# Patient Record
Sex: Female | Born: 1985
Health system: Southern US, Community
[De-identification: ages and names within clinical notes are randomized; demographics above are authoritative.]

## PROBLEM LIST (undated history)

## (undated) DIAGNOSIS — D649 Anemia, unspecified: Secondary | ICD-10-CM

## (undated) DIAGNOSIS — Z789 Other specified health status: Secondary | ICD-10-CM

## (undated) DIAGNOSIS — O139 Gestational [pregnancy-induced] hypertension without significant proteinuria, unspecified trimester: Secondary | ICD-10-CM

## (undated) HISTORY — DX: Other specified health status: Z78.9

## (undated) HISTORY — PX: NO PAST SURGERIES: SHX2092

---

## 2019-10-10 DIAGNOSIS — Z419 Encounter for procedure for purposes other than remedying health state, unspecified: Secondary | ICD-10-CM | POA: Diagnosis not present

## 2019-11-10 DIAGNOSIS — Z419 Encounter for procedure for purposes other than remedying health state, unspecified: Secondary | ICD-10-CM | POA: Diagnosis not present

## 2019-12-10 DIAGNOSIS — Z419 Encounter for procedure for purposes other than remedying health state, unspecified: Secondary | ICD-10-CM | POA: Diagnosis not present

## 2019-12-17 DIAGNOSIS — O3680X1 Pregnancy with inconclusive fetal viability, fetus 1: Secondary | ICD-10-CM | POA: Diagnosis not present

## 2019-12-17 DIAGNOSIS — Z3402 Encounter for supervision of normal first pregnancy, second trimester: Secondary | ICD-10-CM | POA: Diagnosis not present

## 2019-12-17 DIAGNOSIS — Z3201 Encounter for pregnancy test, result positive: Secondary | ICD-10-CM | POA: Diagnosis not present

## 2019-12-17 LAB — OB RESULTS CONSOLE ABO/RH: RH Type: NEGATIVE

## 2019-12-17 LAB — OB RESULTS CONSOLE VARICELLA ZOSTER ANTIBODY, IGG: Varicella: IMMUNE

## 2019-12-17 LAB — HEPATITIS C ANTIBODY: HCV Ab: NEGATIVE

## 2019-12-17 LAB — CYTOLOGY - PAP: Pap: NEGATIVE

## 2019-12-17 LAB — OB RESULTS CONSOLE HIV ANTIBODY (ROUTINE TESTING): HIV: NONREACTIVE

## 2019-12-17 LAB — OB RESULTS CONSOLE HEPATITIS B SURFACE ANTIGEN: Hepatitis B Surface Ag: NEGATIVE

## 2019-12-17 LAB — OB RESULTS CONSOLE ANTIBODY SCREEN: Antibody Screen: NEGATIVE

## 2019-12-17 LAB — OB RESULTS CONSOLE RUBELLA ANTIBODY, IGM: Rubella: IMMUNE

## 2019-12-17 LAB — OB RESULTS CONSOLE HGB/HCT, BLOOD
HCT: 36 (ref 29–41)
Hemoglobin: 12.4

## 2019-12-17 LAB — CYSTIC FIBROSIS DIAGNOSTIC STUDY: Interpretation-CFDNA:: NORMAL

## 2019-12-17 LAB — OB RESULTS CONSOLE RPR: RPR: NONREACTIVE

## 2019-12-17 LAB — OB RESULTS CONSOLE TSH: TSH: 1.49

## 2019-12-17 LAB — SICKLE CELL SCREEN: Sickle Cell Screen: NORMAL

## 2019-12-17 LAB — OB RESULTS CONSOLE PLATELET COUNT: Platelets: 206

## 2019-12-31 DIAGNOSIS — Z3402 Encounter for supervision of normal first pregnancy, second trimester: Secondary | ICD-10-CM | POA: Diagnosis not present

## 2019-12-31 DIAGNOSIS — Z1379 Encounter for other screening for genetic and chromosomal anomalies: Secondary | ICD-10-CM | POA: Diagnosis not present

## 2020-01-10 DIAGNOSIS — Z419 Encounter for procedure for purposes other than remedying health state, unspecified: Secondary | ICD-10-CM | POA: Diagnosis not present

## 2020-01-25 DIAGNOSIS — Z3689 Encounter for other specified antenatal screening: Secondary | ICD-10-CM | POA: Diagnosis not present

## 2020-02-09 DIAGNOSIS — Z419 Encounter for procedure for purposes other than remedying health state, unspecified: Secondary | ICD-10-CM | POA: Diagnosis not present

## 2020-03-11 DIAGNOSIS — Z419 Encounter for procedure for purposes other than remedying health state, unspecified: Secondary | ICD-10-CM | POA: Diagnosis not present

## 2020-03-18 DIAGNOSIS — Z3A27 27 weeks gestation of pregnancy: Secondary | ICD-10-CM | POA: Diagnosis not present

## 2020-03-18 DIAGNOSIS — K920 Hematemesis: Secondary | ICD-10-CM | POA: Diagnosis not present

## 2020-03-18 DIAGNOSIS — O26893 Other specified pregnancy related conditions, third trimester: Secondary | ICD-10-CM | POA: Diagnosis not present

## 2020-03-18 DIAGNOSIS — O212 Late vomiting of pregnancy: Secondary | ICD-10-CM | POA: Diagnosis not present

## 2020-03-21 DIAGNOSIS — Z131 Encounter for screening for diabetes mellitus: Secondary | ICD-10-CM | POA: Diagnosis not present

## 2020-03-22 LAB — OB RESULTS CONSOLE RPR: RPR: NONREACTIVE

## 2020-03-22 LAB — OB RESULTS CONSOLE HIV ANTIBODY (ROUTINE TESTING): HIV: NONREACTIVE

## 2020-03-22 LAB — GLUCOSE TOLERANCE, 1 HOUR: Glucose, 1 Hour GTT: 117

## 2020-03-31 ENCOUNTER — Encounter (HOSPITAL_COMMUNITY): Payer: Self-pay | Admitting: Obstetrics and Gynecology

## 2020-03-31 ENCOUNTER — Inpatient Hospital Stay (HOSPITAL_COMMUNITY)
Admission: AD | Admit: 2020-03-31 | Discharge: 2020-03-31 | Disposition: A | Discharge status: Home / Self Care | Payer: Medicaid Other | Attending: Family Medicine | Admitting: Family Medicine

## 2020-03-31 ENCOUNTER — Other Ambulatory Visit: Payer: Self-pay

## 2020-03-31 DIAGNOSIS — O360130 Maternal care for anti-D [Rh] antibodies, third trimester, not applicable or unspecified: Secondary | ICD-10-CM

## 2020-03-31 DIAGNOSIS — O26893 Other specified pregnancy related conditions, third trimester: Secondary | ICD-10-CM | POA: Insufficient documentation

## 2020-03-31 DIAGNOSIS — Z6791 Unspecified blood type, Rh negative: Secondary | ICD-10-CM | POA: Diagnosis not present

## 2020-03-31 DIAGNOSIS — O26899 Other specified pregnancy related conditions, unspecified trimester: Secondary | ICD-10-CM

## 2020-03-31 DIAGNOSIS — Z3A30 30 weeks gestation of pregnancy: Secondary | ICD-10-CM

## 2020-03-31 MED ORDER — ONDANSETRON 4 MG PO TBDP: 4.0000 mg | ORAL_TABLET | Freq: Four times a day (QID) | ORAL | 0 refills | Status: DC | PRN

## 2020-03-31 MED ORDER — PREPLUS 27-1 MG PO TABS: 1.0000 | ORAL_TABLET | Freq: Every day | ORAL | 13 refills | Status: DC

## 2020-03-31 NOTE — MAU Note (Signed)
Presents for Rhogam injection.  Reports moving from [place], [place] to [place], [place] hasn't established care @ any office yet but needs Rhogam injection.  Denies any complaints.

## 2020-03-31 NOTE — MAU Provider Note (Signed)
Event Date/Time   First Provider Initiated Contact with Patient 03/31/20 1104      S Ashley Michael is a 35 y.o. G1P0 patient who presents to MAU today with complaint of needing Rhogam shot. [place]. No bleeding, cramping, spotting, leaking fluid. Is vomiting every morning.   O BP 127/88 (BP Location: Right Arm)   Pulse (!) 101   Temp 98 F (36.7 C) (Oral)   Resp 20   Ht 5\' 2"  (1.575 m)   Wt 80.7 kg   LMP 09/01/2019   SpO2 100%   BMI 32.54 kg/m  Physical Exam Vitals and nursing note reviewed.  Constitutional:      Appearance: Normal appearance.  Skin:    General: Skin is warm and dry.     Capillary Refill: Capillary refill takes less than 2 seconds.  Neurological:     General: No focal deficit present.     Mental Status: She is alert and oriented to person, place, and time.  Psychiatric:        Mood and Affect: Mood normal.        Behavior: Behavior normal.        Thought Content: Thought content normal.        Judgment: Judgment normal.     A Medical screening exam complete   ICD-10-CM   1. Rh negative state in antepartum period  O26.899    Z67.91   2. [redacted] weeks gestation of pregnancy  Z3A.30      P Discharge from MAU in stable condition Will arrange for patient to receive Rhogam injection at [facility].  If she starts bleeding, she is to return. Warning signs for worsening condition that would warrant emergency follow-up discussed Patient may return to MAU as needed   [facility], DO 03/31/2020 11:09 AM

## 2020-04-04 ENCOUNTER — Other Ambulatory Visit: Payer: Self-pay

## 2020-04-04 ENCOUNTER — Encounter: Payer: Self-pay | Admitting: Nurse Practitioner

## 2020-04-04 ENCOUNTER — Ambulatory Visit (INDEPENDENT_AMBULATORY_CARE_PROVIDER_SITE_OTHER): Payer: Medicaid Other | Admitting: Nurse Practitioner

## 2020-04-04 VITALS — BP 120/84 | HR 77 | Wt 181.0 lb

## 2020-04-04 DIAGNOSIS — Z23 Encounter for immunization: Secondary | ICD-10-CM | POA: Diagnosis not present

## 2020-04-04 DIAGNOSIS — F17200 Nicotine dependence, unspecified, uncomplicated: Secondary | ICD-10-CM | POA: Diagnosis not present

## 2020-04-04 DIAGNOSIS — O99333 Smoking (tobacco) complicating pregnancy, third trimester: Secondary | ICD-10-CM | POA: Diagnosis not present

## 2020-04-04 DIAGNOSIS — Z3A30 30 weeks gestation of pregnancy: Secondary | ICD-10-CM | POA: Diagnosis not present

## 2020-04-04 DIAGNOSIS — Z7189 Other specified counseling: Secondary | ICD-10-CM

## 2020-04-04 DIAGNOSIS — Z3400 Encounter for supervision of normal first pregnancy, unspecified trimester: Secondary | ICD-10-CM | POA: Insufficient documentation

## 2020-04-04 DIAGNOSIS — Z3403 Encounter for supervision of normal first pregnancy, third trimester: Secondary | ICD-10-CM

## 2020-04-04 DIAGNOSIS — Z818 Family history of other mental and behavioral disorders: Secondary | ICD-10-CM | POA: Diagnosis not present

## 2020-04-04 MED ORDER — RHO D IMMUNE GLOBULIN 1500 UNIT/2ML IJ SOSY
300.0000 ug | PREFILLED_SYRINGE | Freq: Once | INTRAMUSCULAR | Status: AC
  Administered 2020-04-04: 300 ug via INTRAMUSCULAR

## 2020-04-04 MED ORDER — PRENATAL PLUS 27-1 MG PO TABS: 1.0000 | ORAL_TABLET | Freq: Every day | ORAL | 7 refills | Status: AC

## 2020-04-04 NOTE — Progress Notes (Signed)
Subjective:   Ashley Michael is a 35 y.o. G1P0000 at [redacted]w[redacted]d by LMP being seen today for her first obstetrical visit.  Her obstetrical history is significant for smoker and family history of postpartum psychosis. Patient does intend to breast feed. Pregnancy history fully reviewed.  Enclosed prenatal records reviewed - early [place] which confirms EDC and had anatomy US on 01-25-20 with normal anatomy  Patient reports vomiting every morning and heartburn at night.  HISTORY: OB History  Gravida Para Term Preterm AB Living  1 0 0 0 0 0  SAB IAB Ectopic Multiple Live Births  0 0 0 0 0    # Outcome Date GA Lbr Len/2nd Weight Sex Delivery Anes PTL Lv  1 Current            Past Medical History:  Diagnosis Date  . Medical history non-contributory    Past Surgical History:  Procedure Laterality Date  . NO PAST SURGERIES     Family History  Problem Relation Age of Onset  . Bipolar disorder Mother      No Known Allergies Current Outpatient Medications on File Prior to Visit  Medication Sig Dispense Refill  . calcium carbonate (TUMS - DOSED IN MG ELEMENTAL CALCIUM) 500 MG chewable tablet Chew 1 tablet by mouth daily.     No current facility-administered medications on file prior to visit.     Exam   Vitals:   04/04/20 0926  BP: 120/84  Pulse: 77  Weight: 181 lb (82.1 kg)   Fetal Heart Rate (bpm): 142  Uterus:  Fundal Height: 31 cm  Pelvic Exam: Perineum:  pelvic deferred   Vulva:    Vagina:     Cervix:    Adnexa:    Bony Pelvis:   System: General: well-developed, well-nourished female in no acute distress   Breast:  deferred   Skin: normal coloration and turgor, no rashes   Neurologic: oriented, normal, negative, normal mood   Extremities: normal strength, tone, and muscle mass, ROM of all joints is normal   HEENT extraocular movement intact and sclera clear, anicteric   Mouth/Teeth deferred   Neck supple and no masses, normal thyroid   Cardiovascular:  regular rate and rhythm   Respiratory:  no respiratory distress, normal breath sounds   Abdomen: soft, non-tender; no masses,  no organomegaly     Assessment:   Pregnancy: G1P0000 Patient Active Problem List   Diagnosis Date Noted  . Supervision of low-risk first pregnancy 04/04/2020  . Current smoker 04/04/2020  . Family history of depression 04/04/2020     Plan:  1. Encounter for supervision of low-risk first pregnancy in third trimester Her MyChart is on her stepmother's phone - her phone is not active at present Does not have BP cuff Thought she would not be able to be pregnant - has never used birth control and has not been pregnant before.  This pregnancy is causing her to shift things in her life.  Definitely a wanted but unexpected pregnancy.  Always had regular menses. Is vomiting every morning - did not realize that stomach being empty triggers vomiting in pregnancy Will try crackers in the morning for vomiting. Takes Tums for heartburn at night - is satisfied with this treatment for now. Declines flu vaccine.  - prenatal vitamin w/FE, FA (PRENATAL 1 + 1) 27-1 MG TABS tablet; Take 1 tablet by mouth daily at 12 noon. (Patient not taking: Reported on 04/04/2020)  Dispense: 30 tablet; Refill: 7 -  Tdap vaccine greater than or equal to 7yo IM - rho (d) immune globulin (RHIG/RHOPHYLAC) injection 300 mcg  2. Current smoker Previously 1ppd, now 7-8 cig per day. May be interested in stopping smoking - does not want to schedule visit with Behavioral Health for help today - may at next [facility].  3. Family history of depression Mother had postpartum psychosis and was hospitalized for depression several times. Declines to meet with Behavorial Health at this time - encouraged that we recommend this visit for all new OB clients, but patient is unwilling to schedule at this visit.  May want to schedule later.  COVID-19 Vaccine Counseling: The patient was counseled on the potential benefits  and lack of known risks of COVID vaccination, during pregnancy and breastfeeding, during today's visit. The patient's questions and concerns were addressed today, including safety of the vaccination and potential side effects as they have been published by ACOG and [facility]. The patient has been informed that there have not been any documented vaccine related injuries, deaths or birth defects to infant or mom after receiving the COVID-19 vaccine to date. The patient has been made aware that although she is not at increased risk of contracting COVID-19 during pregnancy, she is at increased risk of developing severe disease and complications if she contracts COVID-19 while pregnant. All patient questions were addressed during our visit today. The patient is not planning to get vaccinated at this time.    Initial labs and ultrasound reviewed from prenatal records. Continue prenatal vitamins. . Problem list reviewed and updated. The nature of [facility] with multiple MDs and other Advanced Practice Providers was explained to patient; also emphasized that residents, students are part of our team. Routine obstetric precautions reviewed. Return in about 2 weeks (around 04/18/2020) for ROB in person.  Total face-to-face time with patient: 40 minutes.  Over 50% of encounter was spent on counseling and coordination of care.     Nolene Bernheim, FNP Family Nurse Practitioner, [facility] for [employer], [facility] 04/04/2020 10:25 AM

## 2020-04-04 NOTE — Patient Instructions (Addendum)
AREA PEDIATRIC/FAMILY PRACTICE PHYSICIANS  [place] ([address]) . [facility] o Chambliss, MD; Eniola, MD; Hale, MD; Hensel, MD; McDiarmid, MD; McIntyer, MD; Neal, MD; Walden, MD o [address] [place], [place] [address] o [phone] o Mon-Fri 8:30-12:30, 1:30-5:00 o Providers come to see babies at [facility] o Accepting Medicaid . [facility] o Limited providers who accept newborns: Koirala, MD; Morrow, MD; Wolters, MD o [address], [place], [place] [address] o [phone] o Mon-Fri 8:00-5:30 o Babies seen by providers at [facility] o Does NOT accept Medicaid o Please call early in hospitalization for appointment (limited availability)  . [facility] o Mulberry, MD o [address] [place], [place] [address] o [phone] o Mon, Tue, Thur, Fri 8:30-5:00, Wed 10:00-7:00 (closed 1-2pm) o Babies seen by [facility] providers o Accepting Medicaid . [facility] - Pediatrician o Rubin, MD o [address], [place], [place] [address] o [phone] o Mon-Fri 8:30-5:00, Sat 8:30-12:00 o Provider comes to see babies at [facility] o Accepting Medicaid o Must have been referred from current patients or contacted office prior to delivery . [facility] ([facility]) o Brown, MD; Chandler, MD; Ettefagh, MD; Grant, MD; Lester, MD; McCormick, MD; McQueen, MD; Prose, MD; Simha, MD; Stanley, MD; Stryffeler, NP; Tebben, NP o [address], [place], [place] [address] o [phone] o Mon, Tue, Thur, Fri 8:30-5:30, Wed 9:30-5:30, Sat 8:30-12:30 o Babies seen by [facility] providers o Accepting Medicaid o Only accepting infants of first-time parents or siblings of current patients o Hospital discharge coordinator will make follow-up appointment . Jack Amos o [address],  [place], [place]  [address] o [phone]   Fax - [phone] . [facility] o [address], [place], [place]  [address] o Phone - [phone]   Fax - [phone] . Shilpa Gosrani o [address], [place], [place]  [address] o [phone]  [place] ([address]) . [facility] o Bates, MD; [place]ass[place], MD; Cooper, Cox, MD; MD; Davis, MD; Dovico, MD; Ettefaugh, MD; Little, MD; Lowe, MD; Keiffer, MD; Melvin, MD; Sumner, MD; Williams, MD o [address], [place], [place] [address] o [phone] o Mon-Fri 8:30-5:00 (extended evenings Mon-Thur as needed), Sat-Sun 10:00-1:00 o Providers come to see babies at [facility] o Accepting Medicaid for families of first-time babies and families with all children in the household age 3 and under. Must register with office prior to making appointment (M-F only). . [facility] o Henson, NP; Knapp, MD; Lalonde, MD; Tysinger, PA o [address] [place], [place] [address] o [phone] o Mon-Fri 8:00-5:00 o Babies seen by providers at [facility] o Does NOT accept Medicaid/Commercial Insurance Only . [facility] ([facility]  o Artis, MD; Barnes, MD; Bratton, MD; Coccaro, MD; Lockett Gardner, MD; Kramer, MD; Marshall, MD; Netherton, MD; Poleto, MD; Skinner, MD o [address] [place], [place] [address] o [phone] o Mon-Fri 8:30-5:30, Sat (Oct.-Mar.) 9:00-1:00 o Babies seen by providers at [facility] o Accepting Medicaid  [place] ([address]) . [facility] o Reid, MD; Warner, MD o [address], [place],  [address] o [phone] o Mon-Fri 8:30-5:00, Sat 8:30-12:00 o Providers come to see babies at [facility] o Does NOT accept Medicaid . [facility] o Becker, PA; Hagler, MD; Scifres, PA; Sun, MD; Swayne, MD o [address],  [place], [place] [address] o [phone] o Mon-Fri 8:00-5:00 o Babies seen by providers at [facility] o Does NOT accept Medicaid o Only accepting babies of parents who are patients o Please call early in hospitalization for appointment (limited availability) . [facility] o Clark, MD; Frye, MD; Kelleher, MD; Mack, NP; Miller, MD; O'Keller, MD; Patterson, NP; Pudlo, MD; Puzio, MD; Thomas, MD; Tucker, MD; Twiselton, MD o [address], [place], [place] [address] o [phone] o Mon-Fri 8:00-5:00, Sat 9:00-12:00 o Providers come to see babies at [facility] o Does NOT accept Medicaid  [place] ([address]) . [facility] o Limited providers accepting new patients: Brake, NP; Wharton, PA o [address], [place], [place] [address] o [phone] o Mon-Fri 8:00-5:00 o Babies seen by providers at [facility] o Does NOT accept Medicaid o Only accepting babies of parents who are patients o Please call early in hospitalization for appointment (limited availability) . [facility] o Gay, MD; Quinlan, MD o [address] [place], [place] [address] o [phone] (press 1 to schedule appointment) o Mon-Fri 8:00-5:00 o Providers come to see babies at [facility] o Does NOT accept Medicaid . [facility] o Mazer, MD o [address] [place], [place] [address] o [phone] o Mon-Fri 8:30-5:00 (lunch 12:30-1:00), extended hours by appointment only Wed 5:00-6:30 o Babies seen by [facility] providers o Accepting Medicaid . [facility] o Banks, MD; Jordan, MD; Koberlein, MD o [address], [place], [place] [address] o [phone] o Mon-Fri 8:00-5:00 o Babies seen by [facility] providers o Does NOT accept Medicaid . [facility] o Parker, MD; Hunter, MD; Wallace, DO o [address] [place], [place]  [address] o [phone] o Mon-Fri 8:00-5:00 o Babies seen by [facility] providers o Does NOT accept Medicaid . [facility] o Brandon, PA; Brecken, PA; Christy, NP; Dees, MD; DeClaire, MD; DeWeese, MD; Hansen, NP; Mills, NP; Parrish, NP; Smoot, NP; Summer, MD; Vapne, MD o [address] [place], [place] [address] o [phone] o Mon-Fri 8:30-5:00, Sat 10:00-1:00 o Providers come to see babies at [facility] o Does NOT accept Medicaid o Free prenatal information session Tuesdays at 4:45pm . [facility] o Bouska, MD; Gordon, PA; Jeffery, PA; Weber, PA o [address] [place] [address] o [phone] o Mon-Fri 7:30-5:30 o Babies seen by [facility] providers . [facility] o [address], [place], [place], [place]  [address] o [phone]   Fax - [phone]  [place] ([phone]) . [facility] o Reese, MD o [address] [place], [place] [address] o [phone] o Mon-Thur 8:00-6:00 o Providers come to see babies at [facility] o Accepting Medicaid . [facility] o Anderson, NP; Badger, MD; Beal, PA; Spencer, PA o [address] [place],  [address] o [phone] o Mon-Thur 7:30-7:30, Fri 7:30-4:30 o Babies seen by [facility] providers o Accepting Medicaid . [facility] o Agbuya, MD; Klett, NP; Romgoolam, MD o [address], ,  [address] o [phone] o Mon-Fri 8:30-5:00, Sat 8:30-12:00 o Providers come to see babies at [facility] o Accepting Medicaid o Must have "Meet & Greet" appointment at office prior to delivery . [facility]  ([facility] ) o McCord,   MD; Juleen China, MD; Clydene Laming, [address], [place], [place] [address] o [phone] o Mon-Wed 8:00-6:00, Thur-Fri 8:00-5:00, Sat 9:00-12:00 o Providers come to  see babies at [facility] o Does NOT accept Medicaid o Only accepting siblings of current patients . [facility] [place], [place], [place], [place]  [address] o [phone]   Fax [phone] . [facility] [address], [place], [place]  [address] o [phone]   Fax - Morton Gorman [phone] & [phone]) . Therapist, music at [place], DO; [place], [address] [place], [place] [address] o [phone] o Mon-Fri 7:00-5:00 o Babies seen by [facility] providers o Does NOT accept Medicaid . [place], MD; [place], [place]; [place], [place], [place], [place] [address] o [phone] o Mon-Fri 8:00-5:00 o Babies seen by [facility] providers o Accepting Medicaid . Lamont, MD; [place], [place]; [place], NP; [place], [place], [place], [place] [address] o [phone] o Mon-Fri 8:00-5:00 o Babies seen by providers at [facility] [place] [phone]) . [facility], [place] o [address] [place], [place] [address] o [phone] o Mon-Fri 8:00-5:00 o Babies seen by [facility] providers o Does NOT accept Medicaid o Limited availability, please call early in hospitalization to schedule follow-up . [facility] Leilani Merl, PA; Maisie Fus, MD; [place], MD; [place], [place]; Jeannine Kitten, MD; [place], [place] [facility] [address], [place], [place] [address] o [phone] o Mon-Fri 8:30-5:00, Sat 9:00-12:00 o Babies seen by providers at [facility] o Accepting Medicaid o Please register online then schedule online or call office o [email or web address] . [place] [facility]  [place]) Kristian Covey, NP; Dwyane Dee, MD; Leonidas Romberg, PA o [address] [place], [place], [place] [address] o [phone] o Mon-Fri 8:00-5:00 o Babies seen by providers at [facility] o Accepting Medicaid . [place] ([facility]) Dairl Ponder, MD; Rayvon Char, NP; Melina Modena, MD o [address] [place], [place], [place] [address] o [phone] o Mon-Fri 8:00-5:30, Sat&Sun by appointment (phones open at 8:30) o Babies seen by [facility] providers o Accepting Medicaid o Must be a first-time baby or sibling of current patient . [place], [address], [place], [place]  [address] o [phone]   Fax - [phone]  [place] [phone] & [phone]) . [place], [place]; [place], [place]; Benjamine Mola, MD; [place], [place]; Harrell Lark, MD o [address] [place], [place] [address] o [phone] o Mon-Thur 8:00-7:00, Fri 8:00-5:00, Sat 8:00-12:00, Sun 9:00-12:00 o Babies seen by [facility] providers o Accepting Medicaid . [facility], MD; Ruthann Cancer, MD; [facility], MD o [address], [place], [place] [address] o [phone] o Mon-Thur 8:00-5:00 o Babies seen by providers at [facility] o Accepting Medicaid . [facility] [place], MD; Coe[place], MD; Amedeo Plenty, MD; Bobby Rumpf, MD; List, MD; Lavonia Drafts, MD; Ruthann Cancer, MD; Selinda Eon, MD; Audie Box, MD; Jim Like, MD; Christie Nottingham, MD; Hubbard Hartshorn, MD; Modena Nunnery, MD o [address] [place], [place]  [address] o [phone] o Mon-Fri 8:00-5:30, Sat (Oct.-Mar.) 9:00-1:00 o Babies seen by providers at [facility] o Accepting Medicaid o Must fill out new patient packet, available online at [email or web address] . [place] ([facility]) Barnabas Lister, NP; [place]on Kingfisher, NP; Claiborne Billings, NP; Rolla Plate, MD;  [place], [place]; Carola Rhine, MD; Tyron Russell, MD; Delia Chimes, NP o [address], [place], [place] [address] o [phone] o Mon-Thur 8:00-5:30, Fri 8:00-5:00 o Babies seen by providers at [place] [phone]) . [place], [place]; [place], MD; Dennard Schaumann, MD; [place], [place] o [address] [place], [place] [address] o [phone] o Mon-Fri 8:00-5:00 o Babies seen by providers at [place] [phone]) . [place] [facility] [place], [place]; Olen Pel, MD; [place], [place], [place], [place] [address] o [phone] o Mon-Fri 8:00-5:00 o Babies seen by providers at [facility] o Does NOT accept Medicaid o Limited appointment availability, please call early in hospitalization  . Therapist, music at [place], [place]; [place], [address], [place], [place] [address] o [phone] o Mon-Fri 8:00-5:00 o Babies seen by [facility] providers o Does NOT accept Medicaid . [facility] Su Grand, MD; Guy Sandifer, MD; [place], [place]; [place], [place] [address], [place], [place] [address] o [phone] o Mon-Fri 8:00-5:00 o After hours clinic [facility] [place], [place] [address]) [phone] Mon-Fri 5:00-8:00, Sat 12:00-6:00, Sun 10:00-4:00 o Babies seen by [facility] providers o Accepting Medicaid . [place] [facility] o [address], [place], [place]  [address] o [phone]   Fax - [phone]  [place] [phone]) . Therapist, music at [facility], MD o [address] [place], [place], [place] [address] o [phone] o Mon-Fri 8:00-5:00 o Babies seen by [facility] providers o Does NOT accept Medicaid . [place] [facility]) Bing Neighbors, MD o [address] [place], [place],   [address] o [phone] o Mon-Thur 8:00-7:00, Fri 8:00-5:00, Sat 8:00-12:00 o Babies seen by providers at [facility] o Accepting Medicaid - but does not have vaccinations in office (must be received elsewhere) o Limited availability, please call early in hospitalization  [place] ([address]) . [place], MD o [address] [place] [address] o [phone]  Fax [phone]   Childbirth Education Options: [facility] Department Classes:  Childbirth education classes can help you get ready for a positive parenting experience. You can also meet other expectant parents and get free stuff for your baby. Each class runs for five weeks on the same night and costs $45 for the mother-to-be and her support person. Medicaid covers the cost if you are eligible. Call [phone] to register. [facility] Childbirth Education:  [phone] or [phone] or [email or web address]  Baby & Me Class: Discuss newborn & infant parenting and family adjustment issues with other new mothers in a relaxed environment. Each week brings a new speaker or baby-centered activity. We encourage new mothers to join [place] every Thursday at 11:00am. Babies birth until crawling. No registration or fee. Daddy [employer]: This course offers Dads-to-be the tools and knowledge needed to feel confident on their journey to becoming new fathers. Experienced dads, who have been trained as coaches, teach dads-to-be how  to hold, comfort, diaper, swaddle and play with their infant while being able to support the new mom as well. A class for men taught by men. $25/dad Big Brother/Big Sister: Let your children share in the joy of a new brother or sister in this special class designed just for them. Class includes discussion about how families care for babies: swaddling, holding, diapering, safety as well as how they can be helpful in their new role. This class is designed for  children ages 77 to 29, but any age is welcome. Please register each child individually. $5/child  Mom Talk: This mom-led group offers support and connection to mothers as they journey through the adjustments and struggles of that sometimes overwhelming first year after the birth of a child. Tuesdays at 10:00am and Thursdays at 6:00pm. Babies welcome. No registration or fee. Breastfeeding Support Group: This group is a mother-to-mother support circle where moms have the opportunity to share their breastfeeding experiences. A Lactation Consultant is present for questions and concerns. Meets each Tuesday at 11:00am. No fee or registration. Breastfeeding Your Baby: Learn what to expect in the first days of breastfeeding your newborn.  This class will help you feel more confident with the skills needed to begin your breastfeeding experience. Many new mothers are concerned about breastfeeding after leaving the hospital. This class will also address the most common fears and challenges about breastfeeding during the first few weeks, months and beyond. (call for fee) Comfort Techniques and Tour: This 2 hour interactive class will provide you the opportunity to learn & practice hands-on techniques that can help relieve some of the discomfort of labor and encourage your baby to rotate toward the best position for birth. You and your partner will be able to try a variety of labor positions with birth balls and rebozos as well as practice breathing, relaxation, and visualization techniques. A tour of the [facility] is included with this class. $20 per registrant and support person Childbirth Class- Weekend Option: This class is a Weekend version of our Birth & Baby series. It is designed for parents who have a difficult time fitting several weeks of classes into their schedule. It covers the care of your newborn and the basics of labor and childbirth. It also includes a [place]  [facility] and lunch. The class is held two consecutive days: beginning on Friday evening from 6:30 - 8:30 p.m. and the next day, Saturday from 9 a.m. - 4 p.m. (call for fee) Doren Custard Class: Interested in a waterbirth?  This informational class will help you discover whether waterbirth is the right fit for you. Education about waterbirth itself, supplies you would need and how to assemble your support team is what you can expect from this class. Some obstetrical practices require this class in order to pursue a waterbirth. (Not all obstetrical practices offer waterbirth-check with your healthcare provider.) Register only the expectant mom, but you are encouraged to bring your partner to class! Required if planning waterbirth, no fee. Infant/Child CPR: Parents, grandparents, babysitters, and friends learn Cardio-Pulmonary Resuscitation skills for infants and children. You will also learn how to treat both conscious and unconscious choking in infants and children. This Family & Friends program does not offer certification. Register each participant individually to ensure that enough mannequins are available. (Call for fee) Grandparent Love: Expecting a grandbaby? This class is for you! Learn about the latest infant care and safety recommendations and ways to support your own child  as he or she transitions into the parenting role. Taught by Registered Nurses who are childbirth instructors, but most importantly...they are grandmothers too! $10/person. Childbirth Class- Natural Childbirth: This series of 5 weekly classes is for expectant parents who want to learn and practice natural methods of coping with the process of labor and childbirth. Relaxation, breathing, massage, visualization, role of the partner, and helpful positioning are highlighted. Participants learn how to be confident in their body's ability to give birth. This class will empower and help parents make informed decisions about their own  care. Includes discussion that will help new parents transition into the immediate postpartum period. Maternity Care Center Tour of [facility] is included. We suggest taking this class between 25-32 weeks, but it's only a recommendation. $75 per registrant and one support person or $30 Medicaid. Childbirth Class- 3 week Series: This option of 3 weekly classes helps you and your labor partner prepare for childbirth. Newborn care, labor & birth, cesarean birth, pain management, and comfort techniques are discussed and a Maternity Care Center Tour of [facility] is included. The class meets at the same time, on the same day of the week for 3 consecutive weeks beginning with the starting date you choose. $60 for registrant and one support person.  Marvelous Multiples: Expecting twins, triplets, or more? This class covers the differences in labor, birth, parenting, and breastfeeding issues that face multiples' parents. NICU tour is included. Led by a Certified Childbirth Educator who is the mother of twins. No fee. Caring for Baby: This class is for expectant and adoptive parents who want to learn and practice the most up-to-date newborn care for their babies. Focus is on birth through the first six weeks of life. Topics include feeding, bathing, diapering, crying, umbilical cord care, circumcision care and safe sleep. Parents learn to recognize symptoms of illness and when to call the pediatrician. Register only the mom-to-be and your partner or support person can plan to come with you! $10 per registrant and support person Childbirth Class- online option: This online class offers you the freedom to complete a Birth and Baby series in the comfort of your own home. The flexibility of this option allows you to review sections at your own pace, at times convenient to you and your support people. It includes additional video information, animations, quizzes, and extended activities. Get organized with helpful  eClass tools, checklists, and trackers. Once you register online for the class, you will receive an email within a few days to accept the invitation and begin the class when the time is right for you. The content will be available to you for 60 days. $60 for 60 days of online access for you and your support people.    Check out Covid vaccine info here:   [email or web address].ncdhhs.gov/vaccines  7 Things You Should Know About the COVID-19 Vaccines  Here are seven facts you should know before taking your shot:  1.  No serious side effects were reported in clinical trials. Temporary reactions after receiving the vaccine may include a sore arm, headache, feeling tired and achy for a day or two or, in some cases, fever. In most cases, these reactions are good signs that your body is building protection.   2.  Scientists had a head start. They are built on decades of research on vaccines for similar viruses. A big investment of resources and focus made sure they were created without skipping any steps in development, testing, or clinical trials.  3. You cannot  get COVID-19 from the vaccine. The vaccine gives your body instructions to make a protein that safely teaches you to make germ-fighting antibodies to fight the real COVID-19.  4.The vaccine protects against the Delta variant. The Delta variant, which is now predominant in [place], is much more contagious than the original virus. Vaccines continue to be remarkably effective in reducing risk of severe disease, hospitalization, and death, even against the Delta variant.  5. A hundred million people in the [place] have already received their COVID-19 vaccine.  6. It works. And once you're fully vaccinated you're protected. The vaccines are proven to help prevent COVID-19 and are effective in preventing hospitalization and death.   7. The vaccine does not affect fertility. Vaccination for those who are pregnant or wanting to become pregnant is  recommended by the [employer] of Obstetricians and Gynecologists (ACOG), the Society for Maternal-Fetal Medicine (SMFM), the American Society for Reproductive Medicine (ASRM), and the Society for Female Reproduction and Urology.           Hormonal Contraception Information Hormonal contraception is a type of birth control that uses hormones to prevent pregnancy. It usually involves a combination of the hormones estrogen and progesterone, or only the hormone progesterone. Hormonal contraception works in these ways:  It thickens the mucus in the cervix, which is the lowest part of the uterus. Thicker mucus makes it harder for sperm to enter the uterus.  It changes the lining of the uterus. This makes it harder for an egg to attach or implant.  It may stop the ovaries from releasing eggs (ovulation). Some women who take hormonal contraceptives that contain only progesterone may continue to ovulate. Hormonal contraception cannot prevent STIs (sexually transmitted infections). Pregnancy may still occur. Types of hormonal contraception Estrogen and progesterone contraceptives Contraceptives that use a combination of estrogen and progesterone are available in these forms:  Pill. Pills come in different combinations of hormones. Pills must be taken at the same time each day. They can affect your period. You can get your period monthly, once every 3 months, or not at all.  Patch. The patch is applied to the buttocks, abdomen, upper outer arm, or back. It is kept in place for 3 weeks. It is removed for the last or fourth week of the cycle.  Vaginal ring. The ring is placed in the vagina and left there for 3 weeks. It is then removed for the last or fourth week of the cycle.   Progesterone-only contraceptives Contraceptives that use only progesterone are available in these forms:  Pill. Pills should be taken at the same time everyday. This is very important to decrease the chance of  pregnancy. Pills containing progestin-only are usually taken every day of the cycle. Other types of pills may have a placebo tablet for the last 4 days of every cycle.  Intrauterine device (IUD). This device is inserted through the vagina and cervix into the uterus. It is removed or replaced every 3 to 5 years, depending on the type. It can be removed sooner.  Implant. Plastic rods are placed under the skin of the upper arm. They are removed or replaced every 3 years. They can be removed sooner.  Shot (injection). The injection is given once every 12 or 13 weeks (about 3 months).   Risks associated with hormonal contraception Estrogen and progesterone contraceptives can sometimes cause side effects, such as:  Nausea.  Headaches.  Breast tenderness.  Bleeding or spotting between menstrual cycles.  High blood  pressure (rare).  Strokes, heart attacks, or blood clots (rare). Progesterone-only contraceptives also can have side effects, such as:  Nausea.  Headaches.  Breast tenderness.  Irregular menstrual bleeding.  High blood pressure (rare). Talk to your health care provider about what side effects may mean for you. Questions to ask:  What type of hormonal contraception is right for me?  How long should I plan to use hormonal contraception?  What are the side effects of the hormonal contraception method I choose?  How can I prevent STIs while using hormonal contraception? Where to find more information Ask your health care provider for more information and resources about hormonal contraception. You can also go to:  [place] Department of Health and [employer], Office on Women's Health: http://hoffman.com/ Summary  Estrogen and progesterone are hormones used in many forms of birth control.  Hormonal contraception cannot prevent STIs (sexually transmitted infections).  Talk to your health care provider about what side effects may mean for you.  Ask your health care  provider for more information and resources about hormonal contraception. This information is not intended to replace advice given to you by your health care provider. Make sure you discuss any questions you have with your health care provider. Document Revised: 11/03/2019 Document Reviewed: 11/03/2019 Elsevier Patient Education  2021 Elsevier Inc.  Intrauterine Device Information An intrauterine device (IUD) is a medical device that is inserted into the uterus to prevent pregnancy. It is a small, T-shaped device that has one or two nylon strings hanging down from it. The strings hang out of the lower part of the uterus (cervix) to allow for future IUD removal. There are two types of IUDs:  Hormone IUD. This type of IUD is made of plastic and contains the hormone progestin (synthetic progesterone). A hormone IUD may last 3-5 years.  Copper IUD. This type of IUD has copper wire wrapped around it. A copper IUD may last up to 10 years. How is an IUD inserted? An IUD is inserted through the vagina, through the cervix, and into the uterus with a minor medical procedure. The procedure for IUD insertion may vary among health care providers and hospitals. How does an IUD work? Synthetic progesterone in a hormonal IUD prevents pregnancy by:  Thickening cervical mucus to prevent sperm from entering the uterus.  Thinning the uterine lining to prevent a fertilized egg from being implanted there. Copper in a copper IUD prevents pregnancy by making the uterus and fallopian tubes produce a fluid that kills sperm. What are the advantages of an IUD? Advantages of either type of IUD An IUD:  Is highly effective in preventing pregnancy.  Is reversible. You can become pregnant shortly after the IUD is removed.  Is low-maintenance and can stay in place for a long time.  Has no estrogen-related side effects.  Can be used when breastfeeding.  Is not associated with weight gain.  Can be inserted right  after childbirth, an abortion, or a miscarriage. Advantages of a hormone IUD  If it is inserted within 7 days of your period starting, it works right after it has been inserted. If the hormone IUD is inserted at any other time in your cycle, you will need to use a backup method of birth control for 7 days after insertion.  It can make menstrual periods lighter or stop completely.  It can reduce menstrual cramping and other discomforts from menstrual periods.  It can be used for 3-5 years, depending on which IUD you have.  Advantages of a copper IUD  It works right after it is inserted.  It can be used as a form of emergency birth control if it is inserted within 5 days after having unprotected sex.  It does not interfere with your body's natural hormones.  It can be used for up to 10 years. What are the disadvantages of an IUD?  An IUD may cause irregular menstrual bleeding for a period of time after insertion.  It is common to have pain during insertion and have cramping and vaginal bleeding after insertion.  An IUD may cut the uterus (uterine perforation) when it is inserted. This is rare.  Pelvic inflammatory disease (PID) may happen after insertion of an IUD. PID is an infection in the uterus and fallopian tubes. The IUD does not cause the infection. The infection is usually from an unknown sexually transmitted infection (STI). This is rare, and it usually happens during the first 20 days after the IUD is inserted.  A copper IUD can make your menstrual flow heavier and more painful.  IUDs cannot prevent sexually transmitted infections (STIs). How is an IUD removed?   You will lie on your back with your knees bent and your feet in footrests (stirrups).  A device will be inserted into your vagina to spread apart the vaginal walls (speculum). This will allow your health care provider to see the strings attached to the IUD.  Your health care provider will use a small instrument  (forceps) to grasp the IUD strings and will pull firmly until the IUD is removed. You may have some discomfort when the IUD is removed. Your health care provider may recommend taking over-the-counter pain relievers, such as ibuprofen, before the procedure. You may also have minor spotting for a few days after the procedure. The procedure for IUD removal may vary among health care providers and hospitals. Is an IUD right for me? If you are interested in an IUD, discuss it with your health care provider. He or she will make sure you are a good candidate for an IUD and will let you know more about the advantages, disadvantage, and possible side effects. This will allow you to make a decision about the device. Summary  An intrauterine device (IUD) is a medical device that is inserted in the uterus to prevent pregnancy. It is a small, T-shaped device that has one or two nylon strings hanging down from it.  A hormone IUD contains the hormone progestin (synthetic progesterone). A copper IUD has copper wire wrapped around it.  Synthetic progesterone in a hormone IUD prevents pregnancy by thickening cervical mucus and thinning the walls of the uterus. Copper in a copper IUD prevents pregnancy by making the uterus and fallopian tubes produce a fluid that kills sperm.  A hormone IUD can be left in place for 3-5 years. A copper IUD can be left in place for up to 10 years.  An IUD is inserted and removed by a health care provider. You may feel some pain during insertion and removal. Your health care provider may recommend taking over-the-counter pain medicine, such as ibuprofen, before an IUD procedure. This information is not intended to replace advice given to you by your health care provider. Make sure you discuss any questions you have with your health care provider. Document Revised: 09/08/2019 Document Reviewed: 09/08/2019 Elsevier Patient Education  2021 [employer].

## 2020-04-05 ENCOUNTER — Encounter: Payer: Self-pay | Admitting: *Deleted

## 2020-04-11 DIAGNOSIS — Z419 Encounter for procedure for purposes other than remedying health state, unspecified: Secondary | ICD-10-CM | POA: Diagnosis not present

## 2020-04-20 ENCOUNTER — Telehealth (INDEPENDENT_AMBULATORY_CARE_PROVIDER_SITE_OTHER): Payer: Medicaid Other | Admitting: Certified Nurse Midwife

## 2020-04-20 DIAGNOSIS — Z3A33 33 weeks gestation of pregnancy: Secondary | ICD-10-CM

## 2020-04-20 DIAGNOSIS — O99333 Smoking (tobacco) complicating pregnancy, third trimester: Secondary | ICD-10-CM

## 2020-04-20 DIAGNOSIS — O26893 Other specified pregnancy related conditions, third trimester: Secondary | ICD-10-CM

## 2020-04-20 DIAGNOSIS — Z3403 Encounter for supervision of normal first pregnancy, third trimester: Secondary | ICD-10-CM

## 2020-04-20 DIAGNOSIS — R12 Heartburn: Secondary | ICD-10-CM

## 2020-04-20 NOTE — Progress Notes (Signed)
   OBSTETRICS PRENATAL VIRTUAL VISIT ENCOUNTER NOTE  Provider location: Center for [facility]   I connected with Ashley Michael on 04/21/20 at  9:15 AM EST by MyChart Video Encounter at home and verified that I am speaking with the correct person using two identifiers.   I discussed the limitations, risks, security and privacy concerns of performing an evaluation and management service virtually and the availability of in person appointments. I also discussed with the patient that there may be a patient responsible charge related to this service. The patient expressed understanding and agreed to proceed. Subjective:  Ashley Michael is a 35 y.o. G1P0000 at [redacted]w[redacted]d being seen today for ongoing prenatal care.  She is currently monitored for the following issues for this low-risk pregnancy and has Supervision of low-risk first pregnancy; Current smoker; and Family history of depression on their problem list.  Patient reports headache, heartburn and overall doing well.  Contractions: Not present. Vag. Bleeding: None.  Movement: Present. Denies any leaking of fluid.   The following portions of the patient's history were reviewed and updated as appropriate: allergies, current medications, past family history, past medical history, past social history, past surgical history and problem list.   Objective:   Fetal Status:     Movement: Present     General:  Alert, oriented and cooperative. Patient is in no acute distress.  Respiratory: Normal respiratory effort, no problems with respiration noted  Mental Status: Normal mood and affect. Normal behavior. Normal judgment and thought content.  Rest of physical exam deferred due to type of encounter  Imaging: No results found.  Assessment and Plan:  Pregnancy: G1P0000 at [redacted]w[redacted]d 1. Encounter for supervision of low-risk first pregnancy in third trimester - Doing well, headaches are not severe and do not  require medication. Heartburn is well-controlled with peppermint Tums, no more than 2/night.  2. [redacted] weeks gestation of pregnancy - Answered questions about use of red raspberry leaf tea and dates in pregnancy, advised dates are clinically shown to shorten first stage of labor (4-6/day). Can try RRL but if she notices uterine irritability to stop and restart after 37wks. Pt verbalized understanding. - Discussed birth control options, she is still considering hormonal options but prefers not to use hormonal BC. Tracks cycles, will use NFP and condoms for birth control. - Anticipatory guidance given regarding next visit being in-person for GBS testing  Preterm labor symptoms and general obstetric precautions including but not limited to vaginal bleeding, contractions, leaking of fluid and fetal movement were reviewed in detail with the patient. I discussed the assessment and treatment plan with the patient. The patient was provided an opportunity to ask questions and all were answered. The patient agreed with the plan and demonstrated an understanding of the instructions. The patient was advised to call back or seek an in-person office evaluation/go to [facility]U at [facility] for any urgent or concerning symptoms. Please refer to After Visit Summary for other counseling recommendations.   I provided 15 minutes of face-to-face time during this encounter.  Return in about 3 weeks (around 05/11/2020) for IN-PERSON, LOB w GBS.  Future Appointments  Date Time Provider Department Center  05/11/2020 10:55 AM Calvert Cantor, CNM [facility]    Bernerd Limbo, CNM Center for [employer], [facility]

## 2020-04-20 NOTE — Patient Instructions (Signed)

## 2020-04-20 NOTE — Progress Notes (Signed)
I connected with  Ashley Michael on 04/20/20 at  9:15 AM EST by mychart video and verified that I am speaking with the correct person using two identifiers.   I discussed the limitations, risks, security and privacy concerns of performing an evaluation and management service by telephone and the availability of in person appointments. I also discussed with the patient that there may be a patient responsible charge related to this service. The patient expressed understanding and agreed to proceed.  Marylynn Pearson, RN 04/20/2020  9:23 AM   Patient does not have BP cuff at home

## 2020-05-09 DIAGNOSIS — Z419 Encounter for procedure for purposes other than remedying health state, unspecified: Secondary | ICD-10-CM | POA: Diagnosis not present

## 2020-05-11 ENCOUNTER — Ambulatory Visit (INDEPENDENT_AMBULATORY_CARE_PROVIDER_SITE_OTHER): Payer: Medicaid Other | Admitting: Advanced Practice Midwife

## 2020-05-11 ENCOUNTER — Other Ambulatory Visit (HOSPITAL_COMMUNITY)
Admission: RE | Admit: 2020-05-11 | Discharge: 2020-05-11 | Disposition: A | Discharge status: Home / Self Care | Payer: Medicaid Other | Source: Ambulatory Visit | Attending: Advanced Practice Midwife | Admitting: Advanced Practice Midwife

## 2020-05-11 ENCOUNTER — Other Ambulatory Visit: Payer: Self-pay

## 2020-05-11 VITALS — BP 127/83 | HR 103 | Wt 184.0 lb

## 2020-05-11 DIAGNOSIS — F17200 Nicotine dependence, unspecified, uncomplicated: Secondary | ICD-10-CM

## 2020-05-11 DIAGNOSIS — Z3A36 36 weeks gestation of pregnancy: Secondary | ICD-10-CM

## 2020-05-11 DIAGNOSIS — Z3403 Encounter for supervision of normal first pregnancy, third trimester: Secondary | ICD-10-CM

## 2020-05-11 DIAGNOSIS — F341 Dysthymic disorder: Secondary | ICD-10-CM

## 2020-05-11 DIAGNOSIS — Z6791 Unspecified blood type, Rh negative: Secondary | ICD-10-CM

## 2020-05-11 DIAGNOSIS — O26899 Other specified pregnancy related conditions, unspecified trimester: Secondary | ICD-10-CM

## 2020-05-11 MED ORDER — OMEPRAZOLE 20 MG PO CPDR: 20.0000 mg | DELAYED_RELEASE_CAPSULE | Freq: Every day | ORAL | 0 refills | Status: DC

## 2020-05-11 NOTE — Progress Notes (Signed)
   PRENATAL VISIT NOTE  Subjective:  Ashley Michael is a 35 y.o. G1P0000 at [redacted]w[redacted]d being seen today for ongoing prenatal care.  She is currently monitored for the following issues for this low-risk pregnancy and has Supervision of low-risk first pregnancy; Current smoker; Family history of depression; and Dysthymia on their problem list.  Patient reports no complaints.  Contractions: Not present. Vag. Bleeding: None.  Movement: Present. Denies leaking of fluid.   The following portions of the patient's history were reviewed and updated as appropriate: allergies, current medications, past family history, past medical history, past social history, past surgical history and problem list. Problem list updated.  Objective:   Vitals:   05/11/20 1112  BP: 127/83  Pulse: (!) 103  Weight: 184 lb (83.5 kg)    Fetal Status: Fetal Heart Rate (bpm): 141 Fundal Height: 36 cm Movement: Present  Presentation: Vertex  General:  Alert, oriented and cooperative. Patient is in no acute distress.  Skin: Skin is warm and dry. No rash noted.   Cardiovascular: Normal heart rate noted  Respiratory: Normal respiratory effort, no problems with respiration noted  Abdomen: Soft, gravid, appropriate for gestational age.  Pain/Pressure: Present     Pelvic: Cervical exam performed Dilation: Closed Effacement (%): Thick Station: Ballotable  Extremities: Normal range of motion.  Edema: None  Mental Status: Normal mood and affect. Normal behavior. Normal judgment and thought content.   Assessment and Plan:  Pregnancy: G1P0000 at [redacted]w[redacted]d  1. Encounter for supervision of low-risk first pregnancy in third trimester - Routine care - Prenatal records reviewed, OB box now up to date - Reviewed impact of GBS POS result on labor, impact of GBS exposure on newborn - Culture, beta strep (group b only) - GC/Chlamydia probe amp (Louise)not at [facility]  2. Dysthymia - Plan for two week postpartum mood check, pt  agreeable  3. [redacted] weeks gestation of pregnancy   4. Current smoker - Congrats on significantly reducing from 1 PPD to 5-6 cigarettes each day  5. Rh negative state in antepartum period - Postpartum Rhogam eval  Preterm labor symptoms and general obstetric precautions including but not limited to vaginal bleeding, contractions, leaking of fluid and fetal movement were reviewed in detail with the patient. Please refer to After Visit Summary for other counseling recommendations.  Return in about 1 week (around 05/18/2020).  Future Appointments  Date Time Provider Department Center  05/18/2020  1:40 PM Adam Phenix, MD [facility]    Calvert Cantor, CNM

## 2020-05-11 NOTE — Patient Instructions (Signed)

## 2020-05-12 LAB — GC/CHLAMYDIA PROBE AMP (~~LOC~~) NOT AT ARMC
Chlamydia: NEGATIVE
Comment: NEGATIVE
Comment: NORMAL
Neisseria Gonorrhea: NEGATIVE

## 2020-05-14 LAB — CULTURE, BETA STREP (GROUP B ONLY): Strep Gp B Culture: POSITIVE — AB

## 2020-05-15 ENCOUNTER — Encounter: Payer: Self-pay | Admitting: Advanced Practice Midwife

## 2020-05-15 DIAGNOSIS — B951 Streptococcus, group B, as the cause of diseases classified elsewhere: Secondary | ICD-10-CM | POA: Insufficient documentation

## 2020-05-18 ENCOUNTER — Ambulatory Visit (INDEPENDENT_AMBULATORY_CARE_PROVIDER_SITE_OTHER): Payer: Medicaid Other | Admitting: Obstetrics & Gynecology

## 2020-05-18 ENCOUNTER — Other Ambulatory Visit: Payer: Self-pay

## 2020-05-18 ENCOUNTER — Encounter: Payer: Self-pay | Admitting: Obstetrics & Gynecology

## 2020-05-18 VITALS — BP 133/88 | HR 75 | Wt 184.4 lb

## 2020-05-18 DIAGNOSIS — Z3403 Encounter for supervision of normal first pregnancy, third trimester: Secondary | ICD-10-CM

## 2020-05-18 DIAGNOSIS — B951 Streptococcus, group B, as the cause of diseases classified elsewhere: Secondary | ICD-10-CM

## 2020-05-18 NOTE — Progress Notes (Signed)
Patient stated that she is having lower back pain and pressure on the pelvic bone

## 2020-05-18 NOTE — Patient Instructions (Signed)

## 2020-05-18 NOTE — Progress Notes (Signed)
   PRENATAL VISIT NOTE  Subjective:  Ashley Michael is a 35 y.o. G1P0000 at [redacted]w[redacted]d being seen today for ongoing prenatal care.  She is currently monitored for the following issues for this low-risk pregnancy and has Supervision of low-risk first pregnancy; Current smoker; Family history of depression; Dysthymia; and Positive GBS test on their problem list.  Patient reports no complaints.   . Vag. Bleeding: None.  Movement: Present. Denies leaking of fluid.   The following portions of the patient's history were reviewed and updated as appropriate: allergies, current medications, past family history, past medical history, past social history, past surgical history and problem list.   Objective:   Vitals:   05/18/20 1411  BP: 133/88  Pulse: 75  Weight: 184 lb 6.4 oz (83.6 kg)    Fetal Status: Fetal Heart Rate (bpm): 156   Movement: Present     General:  Alert, oriented and cooperative. Patient is in no acute distress.  Skin: Skin is warm and dry. No rash noted.   Cardiovascular: Normal heart rate noted  Respiratory: Normal respiratory effort, no problems with respiration noted  Abdomen: Soft, gravid, appropriate for gestational age.  Pain/Pressure: Present     Pelvic: Cervical exam deferred        Extremities: Normal range of motion.  Edema: None  Mental Status: Normal mood and affect. Normal behavior. Normal judgment and thought content.   Assessment and Plan:  Pregnancy: G1P0000 at [redacted]w[redacted]d 1. Positive GBS test Explained treatment in labor  2. Encounter for supervision of low-risk first pregnancy in third trimester   Preterm labor symptoms and general obstetric precautions including but not limited to vaginal bleeding, contractions, leaking of fluid and fetal movement were reviewed in detail with the patient. Please refer to After Visit Summary for other counseling recommendations.   Return in about 1 week (around 05/25/2020).  No future appointments.  Scheryl Darter,  MD

## 2020-05-29 ENCOUNTER — Ambulatory Visit (INDEPENDENT_AMBULATORY_CARE_PROVIDER_SITE_OTHER): Payer: Medicaid Other | Admitting: Nurse Practitioner

## 2020-05-29 ENCOUNTER — Other Ambulatory Visit: Payer: Self-pay

## 2020-05-29 VITALS — BP 133/89 | HR 98 | Wt 181.8 lb

## 2020-05-29 DIAGNOSIS — Z3403 Encounter for supervision of normal first pregnancy, third trimester: Secondary | ICD-10-CM

## 2020-05-29 DIAGNOSIS — Z3A38 38 weeks gestation of pregnancy: Secondary | ICD-10-CM

## 2020-05-29 DIAGNOSIS — F17200 Nicotine dependence, unspecified, uncomplicated: Secondary | ICD-10-CM

## 2020-05-29 DIAGNOSIS — R030 Elevated blood-pressure reading, without diagnosis of hypertension: Secondary | ICD-10-CM | POA: Diagnosis not present

## 2020-05-29 DIAGNOSIS — B951 Streptococcus, group B, as the cause of diseases classified elsewhere: Secondary | ICD-10-CM

## 2020-05-29 NOTE — Progress Notes (Signed)
Pt states has had nausea & diarrhea for the past week.

## 2020-05-29 NOTE — Progress Notes (Signed)
    Subjective:  Ashley Michael is a 35 y.o. G1P0000 at [redacted]w[redacted]d being seen today for ongoing prenatal care.  She is currently monitored for the following issues for this high-risk pregnancy and has Supervision of low-risk first pregnancy; Current smoker; Family history of depression; Dysthymia; and Positive GBS test on their problem list.  Patient reports nausea and diarrhea.  Contractions: Not present.  .  Movement: Present. Denies leaking of fluid.   The following portions of the patient's history were reviewed and updated as appropriate: allergies, current medications, past family history, past medical history, past social history, past surgical history and problem list. Problem list updated.  Objective:   Vitals:   05/29/20 1458 05/29/20 1514  BP: (!) 138/102 (!) 140/92  Pulse: 99 98  Weight: 181 lb 12.8 oz (82.5 kg)     Fetal Status: Fetal Heart Rate (bpm): 152 Fundal Height: 38 cm Movement: Present  Presentation: Vertex  General:  Alert, oriented and cooperative. Patient is in no acute distress.  Skin: Skin is warm and dry. No rash noted.   Cardiovascular: Normal heart rate noted  Respiratory: Normal respiratory effort, no problems with respiration noted  Abdomen: Soft, gravid, appropriate for gestational age. Pain/Pressure: Present     Pelvic:  Cervical exam performed Dilation: Closed Effacement (%): Thick Station: Ballotable  Extremities: Normal range of motion.  Edema: None  Mental Status: Normal mood and affect. Normal behavior. Normal judgment and thought content.   Urinalysis:      Assessment and Plan:  Pregnancy: G1P0000 at [redacted]w[redacted]d  1. Encounter for supervision of low-risk first pregnancy in third trimester Cervix unchanged and closed Have a few Braxton Hicks contractions  Having diarrhea  - CBC - Comprehensive metabolic panel - Protein / creatinine ratio, urine  2. Elevated BP without diagnosis of hypertension Consult with Dr. Adrian Blackwater in MAU - first  episode of elevated BP and no severe range NST done today - category 1  - CBC - Comprehensive metabolic panel - Protein / creatinine ratio, urine  3. Positive GBS test No known allergies  4. Smoker Smokes approx. 5 cigs per day Advised not to smoke    Term labor symptoms and general obstetric precautions including but not limited to vaginal bleeding, contractions, leaking of fluid and fetal movement were reviewed in detail with the patient. Please refer to After Visit Summary for other counseling recommendations.  Return in about 1 day (around 05/30/2020) for ROB and repeat BP - overbook.  Nolene Bernheim, RN, MSN, NP-BC Nurse Practitioner, [facility] for [employer], [facility] 05/29/2020 3:29 PM

## 2020-05-29 NOTE — Patient Instructions (Signed)

## 2020-05-30 ENCOUNTER — Other Ambulatory Visit: Payer: Self-pay

## 2020-05-30 ENCOUNTER — Ambulatory Visit (INDEPENDENT_AMBULATORY_CARE_PROVIDER_SITE_OTHER): Payer: Medicaid Other | Admitting: Obstetrics and Gynecology

## 2020-05-30 ENCOUNTER — Inpatient Hospital Stay (HOSPITAL_COMMUNITY)
Admission: AD | Admit: 2020-05-30 | Discharge: 2020-06-03 | DRG: 787 | Disposition: A | Discharge status: Home / Self Care | Payer: Medicaid Other | Attending: Obstetrics & Gynecology | Admitting: Obstetrics & Gynecology

## 2020-05-30 ENCOUNTER — Encounter (HOSPITAL_COMMUNITY): Payer: Self-pay | Admitting: Family Medicine

## 2020-05-30 VITALS — BP 140/98 | HR 107 | Wt 180.9 lb

## 2020-05-30 DIAGNOSIS — F1290 Cannabis use, unspecified, uncomplicated: Secondary | ICD-10-CM | POA: Diagnosis present

## 2020-05-30 DIAGNOSIS — F17210 Nicotine dependence, cigarettes, uncomplicated: Secondary | ICD-10-CM | POA: Diagnosis present

## 2020-05-30 DIAGNOSIS — Z6791 Unspecified blood type, Rh negative: Secondary | ICD-10-CM

## 2020-05-30 DIAGNOSIS — F341 Dysthymic disorder: Secondary | ICD-10-CM | POA: Diagnosis present

## 2020-05-30 DIAGNOSIS — O9081 Anemia of the puerperium: Secondary | ICD-10-CM | POA: Diagnosis not present

## 2020-05-30 DIAGNOSIS — Z20822 Contact with and (suspected) exposure to covid-19: Secondary | ICD-10-CM | POA: Diagnosis present

## 2020-05-30 DIAGNOSIS — O26893 Other specified pregnancy related conditions, third trimester: Secondary | ICD-10-CM | POA: Diagnosis present

## 2020-05-30 DIAGNOSIS — O134 Gestational [pregnancy-induced] hypertension without significant proteinuria, complicating childbirth: Principal | ICD-10-CM | POA: Diagnosis present

## 2020-05-30 DIAGNOSIS — Z3490 Encounter for supervision of normal pregnancy, unspecified, unspecified trimester: Secondary | ICD-10-CM | POA: Diagnosis present

## 2020-05-30 DIAGNOSIS — D62 Acute posthemorrhagic anemia: Secondary | ICD-10-CM | POA: Diagnosis not present

## 2020-05-30 DIAGNOSIS — O99324 Drug use complicating childbirth: Secondary | ICD-10-CM | POA: Diagnosis present

## 2020-05-30 DIAGNOSIS — O99334 Smoking (tobacco) complicating childbirth: Secondary | ICD-10-CM | POA: Diagnosis not present

## 2020-05-30 DIAGNOSIS — B951 Streptococcus, group B, as the cause of diseases classified elsewhere: Secondary | ICD-10-CM | POA: Diagnosis present

## 2020-05-30 DIAGNOSIS — O133 Gestational [pregnancy-induced] hypertension without significant proteinuria, third trimester: Secondary | ICD-10-CM

## 2020-05-30 DIAGNOSIS — O139 Gestational [pregnancy-induced] hypertension without significant proteinuria, unspecified trimester: Secondary | ICD-10-CM | POA: Diagnosis present

## 2020-05-30 DIAGNOSIS — O99824 Streptococcus B carrier state complicating childbirth: Secondary | ICD-10-CM | POA: Diagnosis present

## 2020-05-30 DIAGNOSIS — Z3A38 38 weeks gestation of pregnancy: Secondary | ICD-10-CM

## 2020-05-30 DIAGNOSIS — F17200 Nicotine dependence, unspecified, uncomplicated: Secondary | ICD-10-CM | POA: Diagnosis present

## 2020-05-30 DIAGNOSIS — Z3403 Encounter for supervision of normal first pregnancy, third trimester: Secondary | ICD-10-CM

## 2020-05-30 DIAGNOSIS — Z818 Family history of other mental and behavioral disorders: Secondary | ICD-10-CM

## 2020-05-30 LAB — COMPREHENSIVE METABOLIC PANEL
ALT: 8 IU/L (ref 0–32)
ALT: 10 U/L (ref 0–44)
AST: 13 IU/L (ref 0–40)
AST: 14 U/L — ABNORMAL LOW (ref 15–41)
Albumin/Globulin Ratio: 1 — ABNORMAL LOW (ref 1.2–2.2)
Albumin: 3.3 g/dL — ABNORMAL LOW (ref 3.8–4.8)
Albumin: 2.4 g/dL — ABNORMAL LOW (ref 3.5–5.0)
Alkaline Phosphatase: 134 IU/L — ABNORMAL HIGH (ref 44–121)
Alkaline Phosphatase: 106 U/L (ref 38–126)
Anion gap: 7 (ref 5–15)
BUN/Creatinine Ratio: 6 — ABNORMAL LOW (ref 9–23)
BUN: 3 mg/dL — ABNORMAL LOW (ref 6–20)
BUN: <5 mg/dL — ABNORMAL LOW (ref 6–20)
Bilirubin Total: <0.2 mg/dL (ref 0.0–1.2)
CO2: 23 mmol/L (ref 20–29)
CO2: 25 mmol/L (ref 22–32)
Calcium: 9 mg/dL (ref 8.7–10.2)
Calcium: 8.4 mg/dL — ABNORMAL LOW (ref 8.9–10.3)
Chloride: 100 mmol/L (ref 96–106)
Chloride: 103 mmol/L (ref 98–111)
Creatinine, Ser: 0.51 mg/dL — ABNORMAL LOW (ref 0.57–1.00)
Creatinine, Ser: 0.52 mg/dL (ref 0.44–1.00)
GFR, Estimated: >60 mL/min (ref >60)
Globulin, Total: 3.2 g/dL (ref 1.5–4.5)
Glucose, Bld: 89 mg/dL (ref 70–99)
Glucose: 88 mg/dL (ref 65–99)
Potassium: 3.7 mmol/L (ref 3.5–5.2)
Potassium: 3 mmol/L — ABNORMAL LOW (ref 3.5–5.1)
Sodium: 138 mmol/L (ref 134–144)
Sodium: 135 mmol/L (ref 135–145)
Total Bilirubin: 0.3 mg/dL (ref 0.3–1.2)
Total Protein: 6.5 g/dL (ref 6.0–8.5)
Total Protein: 5.8 g/dL — ABNORMAL LOW (ref 6.5–8.1)
eGFR: 126 mL/min/{1.73_m2} (ref >59)

## 2020-05-30 LAB — CBC
HCT: 30.3 % — ABNORMAL LOW (ref 36.0–46.0)
Hematocrit: 32.5 % — ABNORMAL LOW (ref 34.0–46.6)
Hemoglobin: 10.3 g/dL — ABNORMAL LOW (ref 11.1–15.9)
Hemoglobin: 9.9 g/dL — ABNORMAL LOW (ref 12.0–15.0)
MCH: 26.6 pg (ref 26.6–33.0)
MCH: 27.3 pg (ref 26.0–34.0)
MCHC: 31.7 g/dL (ref 31.5–35.7)
MCHC: 32.7 g/dL (ref 30.0–36.0)
MCV: 84 fL (ref 79–97)
MCV: 83.7 fL (ref 80.0–100.0)
Platelets: 226 10*3/uL (ref 150–450)
Platelets: 228 10*3/uL (ref 150–400)
RBC: 3.87 x10E6/uL (ref 3.77–5.28)
RBC: 3.62 MIL/uL — ABNORMAL LOW (ref 3.87–5.11)
RDW: 12.6 % (ref 11.7–15.4)
RDW: 13.3 % (ref 11.5–15.5)
WBC: 7.7 10*3/uL (ref 3.4–10.8)
WBC: 8 10*3/uL (ref 4.0–10.5)
nRBC: 0 % (ref 0.0–0.2)

## 2020-05-30 LAB — RAPID URINE DRUG SCREEN, HOSP PERFORMED
Amphetamines: NOT DETECTED
Barbiturates: NOT DETECTED
Benzodiazepines: NOT DETECTED
Cocaine: NOT DETECTED
Opiates: NOT DETECTED
Tetrahydrocannabinol: NOT DETECTED

## 2020-05-30 LAB — PROTEIN / CREATININE RATIO, URINE
Creatinine, Urine: 287.3 mg/dL
Creatinine, Urine: 171.42 mg/dL
Protein Creatinine Ratio: 0.13 mg/mg{Cre} (ref 0.00–0.15)
Protein, Ur: 49.4 mg/dL
Protein/Creat Ratio: 172 mg/g creat (ref 0–200)
Total Protein, Urine: 22 mg/dL

## 2020-05-30 LAB — RESP PANEL BY RT-PCR (FLU A&B, COVID) ARPGX2
Influenza A by PCR: NEGATIVE
Influenza B by PCR: NEGATIVE
SARS Coronavirus 2 by RT PCR: NEGATIVE

## 2020-05-30 MED ORDER — LACTATED RINGERS IV SOLN
500.0000 mL | INTRAVENOUS | Status: DC | PRN
  Administered 2020-06-01: 500 mL via INTRAVENOUS

## 2020-05-30 MED ORDER — PENICILLIN G POT IN DEXTROSE 60000 UNIT/ML IV SOLN
3.0000 10*6.[IU] | INTRAVENOUS | Status: DC
  Administered 2020-05-30 – 2020-06-01 (×10): 3 10*6.[IU] via INTRAVENOUS
  Filled 2020-05-30 (×10): qty 50

## 2020-05-30 MED ORDER — SOD CITRATE-CITRIC ACID 500-334 MG/5ML PO SOLN
30.0000 mL | ORAL | Status: DC | PRN
  Filled 2020-05-30: qty 15

## 2020-05-30 MED ORDER — FENTANYL CITRATE (PF) 100 MCG/2ML IJ SOLN
50.0000 ug | INTRAMUSCULAR | Status: DC | PRN
  Administered 2020-05-30: 100 ug via INTRAVENOUS
  Administered 2020-05-31 (×2): 50 ug via INTRAVENOUS
  Administered 2020-05-31: 100 ug via INTRAVENOUS
  Filled 2020-05-30 (×3): qty 2

## 2020-05-30 MED ORDER — ACETAMINOPHEN 325 MG PO TABS: 650.0000 mg | ORAL_TABLET | ORAL | Status: DC | PRN

## 2020-05-30 MED ORDER — OXYTOCIN-SODIUM CHLORIDE 30-0.9 UT/500ML-% IV SOLN
2.5000 [IU]/h | INTRAVENOUS | Status: DC
  Filled 2020-05-30: qty 500

## 2020-05-30 MED ORDER — ONDANSETRON HCL 4 MG/2ML IJ SOLN
4.0000 mg | Freq: Four times a day (QID) | INTRAMUSCULAR | Status: DC | PRN
  Administered 2020-05-31 – 2020-06-01 (×2): 4 mg via INTRAVENOUS
  Filled 2020-05-30 (×2): qty 2

## 2020-05-30 MED ORDER — OXYTOCIN BOLUS FROM INFUSION: 333.0000 mL | Freq: Once | INTRAVENOUS | Status: DC

## 2020-05-30 MED ORDER — LACTATED RINGERS IV SOLN: INTRAVENOUS | Status: DC

## 2020-05-30 MED ORDER — SODIUM CHLORIDE 0.9 % IV SOLN
5.0000 10*6.[IU] | Freq: Once | INTRAVENOUS | Status: AC
  Administered 2020-05-30: 5 10*6.[IU] via INTRAVENOUS
  Filled 2020-05-30: qty 5

## 2020-05-30 MED ORDER — MISOPROSTOL 50MCG HALF TABLET
50.0000 ug | ORAL_TABLET | ORAL | Status: DC | PRN
  Administered 2020-05-30 – 2020-05-31 (×3): 50 ug via BUCCAL
  Filled 2020-05-30 (×3): qty 1

## 2020-05-30 MED ORDER — LIDOCAINE HCL (PF) 1 % IJ SOLN: 30.0000 mL | INTRAMUSCULAR | Status: DC | PRN

## 2020-05-30 NOTE — H&P (Addendum)
Ashley Michael is a 35 y.o. female presenting for IOL for Gestational Hypertension. On admission patient denies headache, visual disturbances, RUQ/epigastric pain, new onset swelling or weight gain. She denies abdominal pain, vaginal bleeding, leaking of fluid, decreased fetal movement, fever, falls, or recent illness.   Patient reports that her pregnancy is only complicated by the gestational hypertension which she found out about 2 days ago.    Patient also reports that she is down to 5 or 6 cigarettes a day from a pack a day when she got pregnant.  She has not used any recreational drugs in the last 6 months but reports marijuana use in the first 3 months of her pregnancy because she did not know she was pregnant.  OB History     Gravida  1   Para  0   Term  0   Preterm  0   AB  0   Living  0      SAB  0   IAB  0   Ectopic  0   Multiple  0   Live Births  0          Past Medical History:  Diagnosis Date   Medical history non-contributory    Past Surgical History:  Procedure Laterality Date   NO PAST SURGERIES     Family History: family history includes Bipolar disorder in her mother. Social History:  reports that she has been smoking cigarettes. She has been smoking about 0.25 packs per day. She has never used smokeless tobacco. She reports previous alcohol use. She reports previous drug use. Drug: Marijuana.     Maternal Diabetes: No Genetic Screening: Normal Maternal Ultrasounds/Referrals: Normal Fetal Ultrasounds or other Referrals:  None Maternal Substance Abuse:  Tobacco use Significant Maternal Medications:  None Significant Maternal Lab Results:  Group B Strep positive Other Comments:  None  Review of Systems  Gastrointestinal: Negative for abdominal pain.  Genitourinary: Negative for vaginal bleeding.  All other systems reviewed and are negative.    Last menstrual period 09/01/2019.   Physical Exam Vitals and nursing note reviewed.  Exam conducted with a chaperone present.  Constitutional:      Appearance: Normal appearance.  Cardiovascular:     Rate and Rhythm: Normal rate.     Pulses: Normal pulses.     Heart sounds: Normal heart sounds.  Pulmonary:     Effort: Pulmonary effort is normal.     Breath sounds: Normal breath sounds.  Abdominal:     Tenderness: There is no abdominal tenderness.     Comments: Gravid  Skin:    Capillary Refill: Capillary refill takes less than 2 seconds.  Neurological:     Mental Status: She is alert and oriented to person, place, and time.  Psychiatric:        Mood and Affect: Mood normal.        Behavior: Behavior normal.        Thought Content: Thought content normal.        Judgment: Judgment normal.      Prenatal labs: ABO, Rh: A/Negative/-- (10/08 0000) Antibody: Negative (10/08 0000) Rubella: Immune (10/08 0000) RPR: Nonreactive (01/12 0000)  HBsAg: Negative (10/08 0000)  HIV: Non-reactive (01/12 0000)  GBS: Positive/-- (03/03 1135)   Assessment/Plan: --35 y.o. G1P0000 at [redacted]w[redacted]d  --IOL for GHTN, PEC labs collected on admission  Labor --Cat I tracing --Closed cervix --GBS Positive-penicillin running --Cervical ripening with Cytotec q 4 hours, plan to assess for foley bulb  placement at next exam --Desires epidural in active labor  Postpartum planning --THC positive 12/2019 per scanned records. Inpt SW consult --Rhogam evaluation --Patient's mother has history of postpartum psychosis --Patient's PMH includes dysthymia --Plan for two week postpartum mood check   Girl/breast.NFP and condoms  Derrel Nip, MD  I spoke with and examined patient and agree with resident/PA-S/MS/SNM's note and plan of care. Denies ha, visual changes, ruq/epigastric pain. BPs stable, pre-e labs normal.  Cheral Marker, CNM, [facility] 05/30/2020 8:46 PM

## 2020-05-30 NOTE — Progress Notes (Addendum)
Ashley Michael is a 35 y.o. G1P0000 at [redacted]w[redacted]d by ultrasound admitted for induction of labor due to gestational hypertension.  Subjective: Patient is comfortable in bed  Objective: BP (!) 141/88   Pulse 71   Temp 98.2 F (36.8 C) (Oral)   Resp 18   Ht 5\' 2"  (1.575 m)   Wt 84 kg   LMP 09/01/2019   BMI 33.86 kg/m  FHT:  FHR: 135 bpm, variability: moderate,  accelerations:  Present,  decelerations:  Absent UC:   irregular, every 3-8 minutes SVE:   Dilation: Fingertip Effacement (%): Thick Station: -2 Exam by:: [email or web address] MD  Labs: Lab Results  Component Value Date   WBC 8.0 05/30/2020   HGB 9.9 (L) 05/30/2020   HCT 30.3 (L) 05/30/2020   MCV 83.7 05/30/2020   PLT 228 05/30/2020    Assessment / Plan: Induction of labor due to gestational hypertension,  progressing well on pitocin  Labor: Progressing normally, foley placed at 2035, if does not come out in 4 hours, will place additional buccal cytotec Preeclampsia:  labs stable Fetal Wellbeing:  Category I Pain Control:  Labor support without medications unless otherwise specified I/D:  Positive  Anticipated MOD:  NSVD  2036 Ashley Michael 05/30/2020, 8:42 PM   I spoke with and examined patient and agree with resident/PA-S/MS/SNM's note and plan of care, with the exception of: pt is not on pitocin. Has received 2 doses of cytotec (last @ 2015), and cervical foley bulb now placed. Denies ha, visual changes, ruq/epigastric pain, n/v. BPs stable. Plan for pitocin when foley bulb out and 4hr from last cytotec. 06/01/2020, CNM, [facility] 05/30/20 @ 2100

## 2020-05-30 NOTE — Progress Notes (Signed)
   PRENATAL VISIT NOTE  Subjective:  Ashley Michael is a 35 y.o. G1P0000 at [redacted]w[redacted]d being seen today for ongoing prenatal care.  She is currently monitored for the following issues for this high-risk pregnancy and has Supervision of low-risk first pregnancy; Current smoker; Family history of depression; Dysthymia; and Positive GBS test on their problem list.  Patient reports no complaints.  Contractions: Irritability. Vag. Bleeding: None.  Movement: Present. Denies leaking of fluid. Denies headaches, vision changes, RUQ pain.   The following portions of the patient's history were reviewed and updated as appropriate: allergies, current medications, past family history, past medical history, past social history, past surgical history and problem list.   Objective:   Vitals:   05/30/20 1056 05/30/20 1104  BP: 129/90 (!) 140/98  Pulse: 93 (!) 107  Weight: 180 lb 14.4 oz (82.1 kg)     Fetal Status: Fetal Heart Rate (bpm): 140   Movement: Present     General:  Alert, oriented and cooperative. Patient is in no acute distress.  Skin: Skin is warm and dry. No rash noted.   Cardiovascular: Normal heart rate noted  Respiratory: Normal respiratory effort, no problems with respiration noted  Abdomen: Soft, gravid, appropriate for gestational age.  Pain/Pressure: Present     Pelvic: Cervical exam deferred        Extremities: Normal range of motion.  Edema: None  Mental Status: Normal mood and affect. Normal behavior. Normal judgment and thought content.   Assessment and Plan:  Pregnancy: G1P0000 at [redacted]w[redacted]d 1. Encounter for supervision of low-risk first pregnancy in third trimester   2. Gestational hypertension, third trimester Elevated blood pressure today and yesterday, now meets criteria for at least gestational HTN. Urine P:C has not yet resulted, normal PEC labs yesterday. Cervix checked yesterday - closed/thick/high. Plan for admission to hospital for IOL. Orders placed for hospital  admission, L&D Attending notified and will contact LD Charge. Patient aware of plan.  3. [redacted] weeks gestation of pregnancy    Gita Kudo, MD

## 2020-05-31 ENCOUNTER — Inpatient Hospital Stay (HOSPITAL_COMMUNITY): Payer: Medicaid Other | Admitting: Anesthesiology

## 2020-05-31 ENCOUNTER — Encounter (HOSPITAL_COMMUNITY): Payer: Self-pay

## 2020-05-31 DIAGNOSIS — O9081 Anemia of the puerperium: Secondary | ICD-10-CM | POA: Diagnosis not present

## 2020-05-31 DIAGNOSIS — Z6791 Unspecified blood type, Rh negative: Secondary | ICD-10-CM | POA: Diagnosis not present

## 2020-05-31 DIAGNOSIS — O99824 Streptococcus B carrier state complicating childbirth: Secondary | ICD-10-CM | POA: Diagnosis not present

## 2020-05-31 DIAGNOSIS — D62 Acute posthemorrhagic anemia: Secondary | ICD-10-CM | POA: Diagnosis not present

## 2020-05-31 DIAGNOSIS — Z20822 Contact with and (suspected) exposure to covid-19: Secondary | ICD-10-CM | POA: Diagnosis not present

## 2020-05-31 DIAGNOSIS — O99334 Smoking (tobacco) complicating childbirth: Secondary | ICD-10-CM | POA: Diagnosis not present

## 2020-05-31 DIAGNOSIS — Z3A39 39 weeks gestation of pregnancy: Secondary | ICD-10-CM | POA: Diagnosis not present

## 2020-05-31 DIAGNOSIS — O134 Gestational [pregnancy-induced] hypertension without significant proteinuria, complicating childbirth: Secondary | ICD-10-CM | POA: Diagnosis not present

## 2020-05-31 DIAGNOSIS — F17210 Nicotine dependence, cigarettes, uncomplicated: Secondary | ICD-10-CM | POA: Diagnosis not present

## 2020-05-31 LAB — CBC
HCT: 28.6 % — ABNORMAL LOW (ref 36.0–46.0)
Hemoglobin: 9.2 g/dL — ABNORMAL LOW (ref 12.0–15.0)
MCH: 27.4 pg (ref 26.0–34.0)
MCHC: 32.2 g/dL (ref 30.0–36.0)
MCV: 85.1 fL (ref 80.0–100.0)
Platelets: 199 10*3/uL (ref 150–400)
RBC: 3.36 MIL/uL — ABNORMAL LOW (ref 3.87–5.11)
RDW: 13.2 % (ref 11.5–15.5)
WBC: 11.4 10*3/uL — ABNORMAL HIGH (ref 4.0–10.5)
nRBC: 0 % (ref 0.0–0.2)

## 2020-05-31 LAB — TYPE AND SCREEN
ABO/RH(D): A NEG
Antibody Screen: POSITIVE

## 2020-05-31 LAB — RPR: RPR Ser Ql: NONREACTIVE

## 2020-05-31 MED ORDER — NALBUPHINE HCL 10 MG/ML IJ SOLN
10.0000 mg | INTRAMUSCULAR | Status: DC | PRN
  Administered 2020-05-31: 10 mg via INTRAMUSCULAR
  Filled 2020-05-31 (×2): qty 1

## 2020-05-31 MED ORDER — EPHEDRINE 5 MG/ML INJ: 10.0000 mg | INTRAVENOUS | Status: DC | PRN

## 2020-05-31 MED ORDER — PHENYLEPHRINE 40 MCG/ML (10ML) SYRINGE FOR IV PUSH (FOR BLOOD PRESSURE SUPPORT): 80.0000 ug | PREFILLED_SYRINGE | INTRAVENOUS | Status: DC | PRN

## 2020-05-31 MED ORDER — TERBUTALINE SULFATE 1 MG/ML IJ SOLN: 0.2500 mg | Freq: Once | INTRAMUSCULAR | Status: DC | PRN

## 2020-05-31 MED ORDER — FENTANYL-BUPIVACAINE-NACL 0.5-0.125-0.9 MG/250ML-% EP SOLN: 12.0000 mL/h | EPIDURAL | Status: DC | PRN

## 2020-05-31 MED ORDER — LACTATED RINGERS IV SOLN: 500.0000 mL | Freq: Once | INTRAVENOUS | Status: DC

## 2020-05-31 MED ORDER — PHENYLEPHRINE 40 MCG/ML (10ML) SYRINGE FOR IV PUSH (FOR BLOOD PRESSURE SUPPORT)
80.0000 ug | PREFILLED_SYRINGE | INTRAVENOUS | Status: DC | PRN
  Filled 2020-05-31: qty 10

## 2020-05-31 MED ORDER — DIPHENHYDRAMINE HCL 50 MG/ML IJ SOLN: 12.5000 mg | INTRAMUSCULAR | Status: DC | PRN

## 2020-05-31 MED ORDER — LIDOCAINE-EPINEPHRINE (PF) 2 %-1:200000 IJ SOLN
INTRAMUSCULAR | Status: DC | PRN
  Administered 2020-05-31: 5 mL via EPIDURAL
  Administered 2020-06-01: 3 mL via EPIDURAL
  Administered 2020-06-01: 2 mL via EPIDURAL
  Administered 2020-06-01: 5 mL via EPIDURAL

## 2020-05-31 MED ORDER — SODIUM CHLORIDE 0.9 % IV SOLN
25.0000 mg | Freq: Four times a day (QID) | INTRAVENOUS | Status: DC | PRN
  Administered 2020-05-31: 25 mg via INTRAVENOUS
  Filled 2020-05-31: qty 1

## 2020-05-31 MED ORDER — NALBUPHINE HCL 10 MG/ML IJ SOLN
10.0000 mg | INTRAMUSCULAR | Status: DC | PRN
  Administered 2020-05-31: 10 mg via INTRAVENOUS
  Filled 2020-05-31: qty 1

## 2020-05-31 MED ORDER — LACTATED RINGERS IV SOLN
500.0000 mL | Freq: Once | INTRAVENOUS | Status: DC
Start: 2020-05-31 — End: 2020-05-31

## 2020-05-31 MED ORDER — OXYTOCIN-SODIUM CHLORIDE 30-0.9 UT/500ML-% IV SOLN
1.0000 m[IU]/min | INTRAVENOUS | Status: DC
  Administered 2020-05-31: 2 m[IU]/min via INTRAVENOUS
  Administered 2020-06-01: 6 m[IU]/min via INTRAVENOUS
  Filled 2020-05-31: qty 500

## 2020-05-31 MED ORDER — FENTANYL-BUPIVACAINE-NACL 0.5-0.125-0.9 MG/250ML-% EP SOLN
12.0000 mL/h | EPIDURAL | Status: DC | PRN
Start: 2020-05-31 — End: 2020-06-01
  Administered 2020-05-31 – 2020-06-01 (×2): 12 mL/h via EPIDURAL
  Filled 2020-05-31 (×2): qty 250

## 2020-05-31 MED ORDER — NALBUPHINE HCL 10 MG/ML IJ SOLN: 10.0000 mg | INTRAMUSCULAR | Status: DC | PRN

## 2020-05-31 NOTE — Anesthesia Preprocedure Evaluation (Addendum)
Anesthesia Evaluation  Patient identified by MRN, date of birth, ID band Patient awake    Reviewed: Allergy & Precautions, NPO status , Patient's Chart, lab work & pertinent test results  Airway Mallampati: I       Dental   Pulmonary neg pulmonary ROS, Current Smoker,    Pulmonary exam normal        Cardiovascular hypertension, Normal cardiovascular exam     Neuro/Psych PSYCHIATRIC DISORDERS Depression negative neurological ROS     GI/Hepatic negative GI ROS, Neg liver ROS,   Endo/Other  negative endocrine ROS  Renal/GU negative Renal ROS  negative genitourinary   Musculoskeletal negative musculoskeletal ROS (+)   Abdominal   Peds negative pediatric ROS (+)  Hematology  (+) anemia ,   Anesthesia Other Findings   Reproductive/Obstetrics negative OB ROS                            Anesthesia Physical Anesthesia Plan  ASA: III  Anesthesia Plan: Epidural   Post-op Pain Management:    Induction:   PONV Risk Score and Plan:   Airway Management Planned: Natural Airway  Additional Equipment: None  Intra-op Plan:   Post-operative Plan:   Informed Consent: I have reviewed the patients History and Physical, chart, labs and discussed the procedure including the risks, benefits and alternatives for the proposed anesthesia with the patient or authorized representative who has indicated his/her understanding and acceptance.       Plan Discussed with:   Anesthesia Plan Comments: (Lab Results      Component                Value               Date                      WBC                      11.4 (H)            05/31/2020                HGB                      9.2 (L)             05/31/2020                HCT                      28.6 (L)            05/31/2020                MCV                      85.1                05/31/2020                PLT                      199                  05/31/2020           )       Anesthesia Quick Evaluation

## 2020-05-31 NOTE — Progress Notes (Addendum)
LABOR PROGRESS NOTE  Ashley Michael is a 35 y.o. G1P0000 at [redacted]w[redacted]d by [place] admitted for IOL due to gHTN.  Subjective: Patient resting comfortably.  Objective: BP 126/76   Pulse 63   Temp 98.2 F (36.8 C)   Resp 17   Ht 5\' 2"  (1.575 m)   Wt 84 kg   LMP 09/01/2019   BMI 33.86 kg/m  or  Vitals:   05/31/20 1102 05/31/20 1201 05/31/20 1302 05/31/20 1401  BP: 112/64 (!) 108/55 (!) 141/78 126/76  Pulse: 65 60 64 63  Resp: 16 16 16 17   Temp:      TempSrc:      Weight:      Height:        Dilation: 4 Effacement (%): 50 Station: -2 Presentation: Vertex Exam by:: MD, [email or web address] RN FHT: baseline rate 150, moderate varibility, + 15x15 acel, no decels Toco: irregular  Labs: Lab Results  Component Value Date   WBC 11.4 (H) 05/31/2020   HGB 9.2 (L) 05/31/2020   HCT 28.6 (L) 05/31/2020   MCV 85.1 05/31/2020   PLT 199 05/31/2020    Patient Active Problem List   Diagnosis Date Noted  . Gestational hypertension 05/30/2020  . Encounter for induction of labor 05/30/2020  . Positive GBS test 05/15/2020  . Dysthymia 05/11/2020  . Supervision of low-risk first pregnancy 04/04/2020  . Current smoker 04/04/2020  . Family history of depression 04/04/2020    Assessment / Plan: 35 y.o. G1P0000 at [redacted]w[redacted]d here for IOL due to gHTN  IOL: s/p AROM w/ clear fluid at 1459. Most recent cervical exam 4, 50, -2. Currently on pit 4mu/min. Received cytotec x3 and FB 2035-0500. Patient's contractions have been difficult to trace, consider IUPC at next cervical exam if unchanged for better monitoring and titrate pit accordingly. Fetal Wellbeing:  Cat I  gHTN: Has had two high BPs in 140/80 range in > 4 hours. Patient is asymptomatic. PreE labs negative on admission. Will continue to monitor BP closely. GBS+: treating with PCN Rh negative: pp rhogam eval Anemia: Hgb 9.2, plan for PO iron postpartum THC+: pt with marijuana use during early pregnancy. Plan for pp SW  consult Dysthymia, fam hx pp psychosis: SW eval, postpartum mood check Pain Control:  Prn per patient, desires epidural Anticipated MOD:  Vaginal  13m, MS3 05/31/2020, 3:05 PM    I saw and evaluated the patient. I agree with the findings and the plan of care as documented in the medical student's note.  Duane Boston, MD [facility] Fellow, [facility] for [facility], [facility]

## 2020-05-31 NOTE — Anesthesia Procedure Notes (Signed)
Epidural Patient location during procedure: OB Start time: 05/31/2020 6:13 PM End time: 05/31/2020 6:19 PM  Staffing Anesthesiologist: Shelton Silvas, MD Performed: anesthesiologist   Preanesthetic Checklist Completed: patient identified, IV checked, site marked, risks and benefits discussed, surgical consent, monitors and equipment checked, pre-op evaluation and timeout performed  Epidural Patient position: sitting Prep: DuraPrep Patient monitoring: heart rate, continuous pulse ox and blood pressure Approach: midline Location: L3-L4 Injection technique: LOR saline  Needle:  Needle type: Tuohy  Needle gauge: 17 G Needle length: 9 cm Catheter type: closed end flexible Catheter size: 20 Guage Test dose: negative and 1.5% lidocaine  Assessment Events: blood not aspirated, injection not painful, no injection resistance and no paresthesia  Additional Notes LOR @4 .5  Patient identified. Risks/Benefits/Options discussed with patient including but not limited to bleeding, infection, nerve damage, paralysis, failed block, incomplete pain control, headache, blood pressure changes, nausea, vomiting, reactions to medications, itching and postpartum back pain. Confirmed with bedside nurse the patient's most recent platelet count. Confirmed with patient that they are not currently taking any anticoagulation, have any bleeding history or any family history of bleeding disorders. Patient expressed understanding and wished to proceed. All questions were answered. Sterile technique was used throughout the entire procedure. Please see nursing notes for vital signs. Test dose was given through epidural catheter and negative prior to continuing to dose epidural or start infusion. Warning signs of high block given to the patient including shortness of breath, tingling/numbness in hands, complete motor block, or any concerning symptoms with instructions to call for help. Patient was given instructions on  fall risk and not to get out of bed. All questions and concerns addressed with instructions to call with any issues or inadequate analgesia.    Reason for block:procedure for pain

## 2020-05-31 NOTE — Progress Notes (Signed)
LABOR PROGRESS NOTE  Ashley Michael is a 35 y.o. G1P0000 at [redacted]w[redacted]d admitted for IOL due to gHTN.  Subjective: Patient resting comfortably. Feeling occasional mild contractions, but no regular contractions.  Objective: BP (!) 114/59   Pulse 66   Temp 98.7 F (37.1 C) (Oral)   Resp 17   Ht 5\' 2"  (1.575 m)   Wt 84 kg   LMP 09/01/2019   BMI 33.86 kg/m  or  Vitals:   05/31/20 0658 05/31/20 0755 05/31/20 0827 05/31/20 0904  BP: 122/78 127/75 (!) 117/57 (!) 114/59  Pulse: 63 (!) 50 65 66  Resp: 16 17 16 17   Temp:      TempSrc:      Weight:      Height:        Dilation: 4 Effacement (%): 70 Station: -3 Presentation: Vertex Exam by:: MD, [email or web address] RN FHT: baseline rate 145, moderate varibility, 15x15 acel, occasion variable decel Toco: very difficult to trace  Labs: Lab Results  Component Value Date   WBC 11.4 (H) 05/31/2020   HGB 9.2 (L) 05/31/2020   HCT 28.6 (L) 05/31/2020   MCV 85.1 05/31/2020   PLT 199 05/31/2020    Patient Active Problem List   Diagnosis Date Noted  . Gestational hypertension 05/30/2020  . Encounter for induction of labor 05/30/2020  . Positive GBS test 05/15/2020  . Dysthymia 05/11/2020  . Supervision of low-risk first pregnancy 04/04/2020  . Current smoker 04/04/2020  . Family history of depression 04/04/2020    Assessment / Plan: 35 y.o. G1P0000 at [redacted]w[redacted]d here for IOL due to gHTN  IOL: s/p cytotec x2 and FB. Currently on pit 34mu/min. Most recent cervical exam 4/70/-3. Patient's contractions have been difficult to trace-- will place IUPC for better monitoring and titrate pit accordingly.  Fetal Wellbeing:  Cat I  gHTN: Has been normotensive over past 4 hrs. Patient is asymptomatic. PreE labs negative on admission. Will continue to monitor BP closely. GBS+: adequately treated with PCN Rh negative: pp rhogam eval Anemia: Hgb 9.2, plan for PO iron postpartum THC+: pt with marijuana use during early pregnancy. Plan  for pp SW consult Dysthymia, fam hx pp psychosis: SW eval, postpartum mood check Pain Control:  Prn per patient, desires epidural Anticipated MOD:  Vaginal  9m, MD PGY-1 [facility] 05/31/2020, 9:50 AM

## 2020-05-31 NOTE — Progress Notes (Signed)
Ashley Michael is a 35 y.o. G1P0000 at [redacted]w[redacted]d by LMP admitted for induction of labor due to gestational hypertension.  Subjective: Patient comfortable after receiving epidural   Objective: BP (!) 147/99   Pulse 76   Temp 98.3 F (36.8 C)   Resp 16   Ht 5\' 2"  (1.575 m)   Wt 84 kg   LMP 09/01/2019   BMI 33.86 kg/m   FHT:  FHR: 140 bpm, variability: moderate,  accelerations:  Present,  decelerations:  Absent UC:   regular, every 2-3 minutes SVE:   Dilation: 4 Effacement (%): 50 Station: -2 Exam by:: [email or web address] RN  Labs: Lab Results  Component Value Date   WBC 11.4 (H) 05/31/2020   HGB 9.2 (L) 05/31/2020   HCT 28.6 (L) 05/31/2020   MCV 85.1 05/31/2020   PLT 199 05/31/2020   Assessment / Plan: Induction of labor due to gestational hypertension,  progressing well on pitocin  Labor: minimal progression with augmentation (pitocin, AROM). GIven difficulty reading contractions, placed IUPC. Will re-check after witnessing 1-2 hours of adequate contractions  Preeclampsia:  labs stable Fetal Wellbeing:  Category I Pain Control:  Epidural I/D:  Positive, received abx Anticipated MOD:  NSVD  06/02/2020 Ashley Michael 05/31/2020, 9:54 PM

## 2020-06-01 ENCOUNTER — Encounter (HOSPITAL_COMMUNITY)
Admission: AD | Disposition: A | Discharge status: Home / Self Care | Payer: Self-pay | Source: Home / Self Care | Attending: Obstetrics & Gynecology

## 2020-06-01 ENCOUNTER — Encounter (HOSPITAL_COMMUNITY): Payer: Self-pay | Admitting: Obstetrics & Gynecology

## 2020-06-01 DIAGNOSIS — O134 Gestational [pregnancy-induced] hypertension without significant proteinuria, complicating childbirth: Secondary | ICD-10-CM | POA: Diagnosis not present

## 2020-06-01 DIAGNOSIS — Z3A00 Weeks of gestation of pregnancy not specified: Secondary | ICD-10-CM | POA: Diagnosis not present

## 2020-06-01 SURGERY — Surgical Case
Anesthesia: Epidural

## 2020-06-01 MED ORDER — DIPHENHYDRAMINE HCL 50 MG/ML IJ SOLN
INTRAMUSCULAR | Status: AC
  Filled 2020-06-01: qty 1

## 2020-06-01 MED ORDER — NALBUPHINE HCL 10 MG/ML IJ SOLN: 5.0000 mg | Freq: Once | INTRAMUSCULAR | Status: DC | PRN

## 2020-06-01 MED ORDER — PRENATAL MULTIVITAMIN CH
1.0000 | ORAL_TABLET | Freq: Every day | ORAL | Status: DC
  Administered 2020-06-02 – 2020-06-03 (×2): 1 via ORAL
  Filled 2020-06-01 (×2): qty 1

## 2020-06-01 MED ORDER — OXYTOCIN-SODIUM CHLORIDE 30-0.9 UT/500ML-% IV SOLN
INTRAVENOUS | Status: AC
  Filled 2020-06-01: qty 500

## 2020-06-01 MED ORDER — MORPHINE SULFATE (PF) 0.5 MG/ML IJ SOLN
INTRAMUSCULAR | Status: AC
  Filled 2020-06-01: qty 10

## 2020-06-01 MED ORDER — SOD CITRATE-CITRIC ACID 500-334 MG/5ML PO SOLN
30.0000 mL | ORAL | Status: AC
  Administered 2020-06-01: 30 mL via ORAL

## 2020-06-01 MED ORDER — TRANEXAMIC ACID-NACL 1000-0.7 MG/100ML-% IV SOLN
INTRAVENOUS | Status: DC | PRN
  Administered 2020-06-01: 1000 mg via INTRAVENOUS

## 2020-06-01 MED ORDER — ONDANSETRON HCL 4 MG/2ML IJ SOLN
INTRAMUSCULAR | Status: DC | PRN
  Administered 2020-06-01: 4 mg via INTRAVENOUS

## 2020-06-01 MED ORDER — DEXAMETHASONE SODIUM PHOSPHATE 4 MG/ML IJ SOLN
INTRAMUSCULAR | Status: AC
  Filled 2020-06-01: qty 1

## 2020-06-01 MED ORDER — OXYCODONE HCL 5 MG PO TABS: 5.0000 mg | ORAL_TABLET | Freq: Once | ORAL | Status: DC | PRN

## 2020-06-01 MED ORDER — NALBUPHINE HCL 10 MG/ML IJ SOLN
5.0000 mg | INTRAMUSCULAR | Status: DC | PRN
  Administered 2020-06-02: 5 mg via INTRAVENOUS
  Filled 2020-06-01: qty 1

## 2020-06-01 MED ORDER — DIPHENHYDRAMINE HCL 50 MG/ML IJ SOLN
12.5000 mg | INTRAMUSCULAR | Status: DC | PRN
  Administered 2020-06-01: 12.5 mg via INTRAVENOUS

## 2020-06-01 MED ORDER — COCONUT OIL OIL
1.0000 | TOPICAL_OIL | Status: DC | PRN
Start: 2020-06-01 — End: 2020-06-03

## 2020-06-01 MED ORDER — TRANEXAMIC ACID-NACL 1000-0.7 MG/100ML-% IV SOLN
INTRAVENOUS | Status: AC
  Filled 2020-06-01: qty 200

## 2020-06-01 MED ORDER — NALBUPHINE HCL 10 MG/ML IJ SOLN: 5.0000 mg | INTRAMUSCULAR | Status: DC | PRN

## 2020-06-01 MED ORDER — SODIUM CHLORIDE 0.9 % IV SOLN
INTRAVENOUS | Status: AC
  Filled 2020-06-01: qty 500

## 2020-06-01 MED ORDER — KETOROLAC TROMETHAMINE 30 MG/ML IJ SOLN
INTRAMUSCULAR | Status: AC
  Filled 2020-06-01: qty 1

## 2020-06-01 MED ORDER — IBUPROFEN 600 MG PO TABS
600.0000 mg | ORAL_TABLET | Freq: Four times a day (QID) | ORAL | Status: DC
  Administered 2020-06-02 – 2020-06-03 (×4): 600 mg via ORAL
  Filled 2020-06-01 (×4): qty 1

## 2020-06-01 MED ORDER — HYDROMORPHONE HCL 1 MG/ML IJ SOLN: 0.2500 mg | INTRAMUSCULAR | Status: DC | PRN

## 2020-06-01 MED ORDER — PROMETHAZINE HCL 25 MG/ML IJ SOLN: 6.2500 mg | INTRAMUSCULAR | Status: DC | PRN

## 2020-06-01 MED ORDER — MENTHOL 3 MG MT LOZG: 1.0000 | LOZENGE | OROMUCOSAL | Status: DC | PRN

## 2020-06-01 MED ORDER — SENNOSIDES-DOCUSATE SODIUM 8.6-50 MG PO TABS
2.0000 | ORAL_TABLET | Freq: Every day | ORAL | Status: DC
  Administered 2020-06-02 – 2020-06-03 (×2): 2 via ORAL
  Filled 2020-06-01 (×2): qty 2

## 2020-06-01 MED ORDER — FENTANYL CITRATE (PF) 100 MCG/2ML IJ SOLN
INTRAMUSCULAR | Status: AC
  Filled 2020-06-01: qty 2

## 2020-06-01 MED ORDER — SODIUM CHLORIDE 0.9% FLUSH: 3.0000 mL | INTRAVENOUS | Status: DC | PRN

## 2020-06-01 MED ORDER — CEFAZOLIN SODIUM-DEXTROSE 2-3 GM-%(50ML) IV SOLR
INTRAVENOUS | Status: DC | PRN
  Administered 2020-06-01: 2 g via INTRAVENOUS

## 2020-06-01 MED ORDER — SODIUM CHLORIDE 0.9 % IV SOLN
2.0000 g | INTRAVENOUS | Status: DC
  Filled 2020-06-01: qty 2

## 2020-06-01 MED ORDER — KETOROLAC TROMETHAMINE 30 MG/ML IJ SOLN
30.0000 mg | Freq: Four times a day (QID) | INTRAMUSCULAR | Status: AC
  Administered 2020-06-02 (×3): 30 mg via INTRAVENOUS
  Filled 2020-06-01 (×3): qty 1

## 2020-06-01 MED ORDER — OXYCODONE HCL 5 MG/5ML PO SOLN: 5.0000 mg | Freq: Once | ORAL | Status: DC | PRN

## 2020-06-01 MED ORDER — MORPHINE SULFATE (PF) 0.5 MG/ML IJ SOLN
INTRAMUSCULAR | Status: DC | PRN
  Administered 2020-06-01: 3 mg via EPIDURAL

## 2020-06-01 MED ORDER — SIMETHICONE 80 MG PO CHEW
80.0000 mg | CHEWABLE_TABLET | Freq: Three times a day (TID) | ORAL | Status: DC
  Administered 2020-06-02 – 2020-06-03 (×3): 80 mg via ORAL
  Filled 2020-06-01 (×3): qty 1

## 2020-06-01 MED ORDER — ENOXAPARIN SODIUM 40 MG/0.4ML ~~LOC~~ SOLN
40.0000 mg | SUBCUTANEOUS | Status: DC
  Administered 2020-06-02: 40 mg via SUBCUTANEOUS
  Filled 2020-06-01: qty 0.4

## 2020-06-01 MED ORDER — SCOPOLAMINE 1 MG/3DAYS TD PT72
1.0000 | MEDICATED_PATCH | Freq: Once | TRANSDERMAL | Status: DC
  Administered 2020-06-01: 1.5 mg via TRANSDERMAL

## 2020-06-01 MED ORDER — LACTATED RINGERS IV SOLN: INTRAVENOUS | Status: DC | PRN

## 2020-06-01 MED ORDER — OXYTOCIN-SODIUM CHLORIDE 30-0.9 UT/500ML-% IV SOLN
INTRAVENOUS | Status: DC | PRN
  Administered 2020-06-01: 300 mL via INTRAVENOUS

## 2020-06-01 MED ORDER — OXYCODONE HCL 5 MG PO TABS
5.0000 mg | ORAL_TABLET | ORAL | Status: DC | PRN
  Administered 2020-06-02: 5 mg via ORAL
  Administered 2020-06-02: 10 mg via ORAL
  Administered 2020-06-03 (×3): 5 mg via ORAL
  Filled 2020-06-01: qty 1
  Filled 2020-06-01 (×2): qty 2
  Filled 2020-06-01 (×2): qty 1

## 2020-06-01 MED ORDER — SCOPOLAMINE 1 MG/3DAYS TD PT72
MEDICATED_PATCH | TRANSDERMAL | Status: AC
  Filled 2020-06-01: qty 1

## 2020-06-01 MED ORDER — FENTANYL CITRATE (PF) 100 MCG/2ML IJ SOLN
INTRAMUSCULAR | Status: DC | PRN
  Administered 2020-06-01: 100 ug via EPIDURAL

## 2020-06-01 MED ORDER — DIBUCAINE (PERIANAL) 1 % EX OINT: 1.0000 "application " | TOPICAL_OINTMENT | CUTANEOUS | Status: DC | PRN

## 2020-06-01 MED ORDER — ONDANSETRON HCL 4 MG/2ML IJ SOLN: 4.0000 mg | Freq: Three times a day (TID) | INTRAMUSCULAR | Status: DC | PRN

## 2020-06-01 MED ORDER — DEXTROSE 5 % IV SOLN
1.0000 ug/kg/h | INTRAVENOUS | Status: DC | PRN
  Filled 2020-06-01: qty 5

## 2020-06-01 MED ORDER — AMISULPRIDE (ANTIEMETIC) 5 MG/2ML IV SOLN: 10.0000 mg | Freq: Once | INTRAVENOUS | Status: DC | PRN

## 2020-06-01 MED ORDER — WITCH HAZEL-GLYCERIN EX PADS
1.0000 | MEDICATED_PAD | CUTANEOUS | Status: DC | PRN
Start: 2020-06-01 — End: 2020-06-03

## 2020-06-01 MED ORDER — MEPERIDINE HCL 25 MG/ML IJ SOLN: 6.2500 mg | INTRAMUSCULAR | Status: DC | PRN

## 2020-06-01 MED ORDER — DIPHENHYDRAMINE HCL 25 MG PO CAPS
25.0000 mg | ORAL_CAPSULE | ORAL | Status: DC | PRN
  Administered 2020-06-01: 25 mg via ORAL
  Filled 2020-06-01: qty 1

## 2020-06-01 MED ORDER — ONDANSETRON HCL 4 MG/2ML IJ SOLN
INTRAMUSCULAR | Status: AC
  Filled 2020-06-01: qty 2

## 2020-06-01 MED ORDER — DIPHENHYDRAMINE HCL 25 MG PO CAPS: 25.0000 mg | ORAL_CAPSULE | Freq: Four times a day (QID) | ORAL | Status: DC | PRN

## 2020-06-01 MED ORDER — SODIUM CHLORIDE 0.9 % IV SOLN
500.0000 mg | Freq: Once | INTRAVENOUS | Status: AC
  Administered 2020-06-01: 500 mg via INTRAVENOUS

## 2020-06-01 MED ORDER — NALOXONE HCL 0.4 MG/ML IJ SOLN: 0.4000 mg | INTRAMUSCULAR | Status: DC | PRN

## 2020-06-01 MED ORDER — OXYTOCIN-SODIUM CHLORIDE 30-0.9 UT/500ML-% IV SOLN: 2.5000 [IU]/h | INTRAVENOUS | Status: AC

## 2020-06-01 MED ORDER — KETOROLAC TROMETHAMINE 30 MG/ML IJ SOLN
30.0000 mg | Freq: Once | INTRAMUSCULAR | Status: AC | PRN
  Administered 2020-06-01: 30 mg via INTRAVENOUS

## 2020-06-01 MED ORDER — DEXAMETHASONE SODIUM PHOSPHATE 10 MG/ML IJ SOLN
INTRAMUSCULAR | Status: DC | PRN
  Administered 2020-06-01: 5 mg via INTRAVENOUS

## 2020-06-01 MED ORDER — ACETAMINOPHEN 325 MG PO TABS
650.0000 mg | ORAL_TABLET | ORAL | Status: DC | PRN
  Administered 2020-06-02 – 2020-06-03 (×3): 650 mg via ORAL
  Filled 2020-06-01 (×3): qty 2

## 2020-06-01 MED ORDER — LIDOCAINE HCL (PF) 2 % IJ SOLN
INTRAMUSCULAR | Status: AC
  Filled 2020-06-01: qty 10

## 2020-06-01 MED ORDER — SIMETHICONE 80 MG PO CHEW: 80.0000 mg | CHEWABLE_TABLET | ORAL | Status: DC | PRN

## 2020-06-01 MED ORDER — ZOLPIDEM TARTRATE 5 MG PO TABS: 5.0000 mg | ORAL_TABLET | Freq: Every evening | ORAL | Status: DC | PRN

## 2020-06-01 SURGICAL SUPPLY — 35 items
BENZOIN TINCTURE PRP APPL 2/3 (GAUZE/BANDAGES/DRESSINGS) ×2 IMPLANT
CHLORAPREP W/TINT 26ML (MISCELLANEOUS) ×2 IMPLANT
CLAMP CORD UMBIL (MISCELLANEOUS) IMPLANT
CLOTH BEACON ORANGE TIMEOUT ST (SAFETY) ×2 IMPLANT
DERMABOND ADVANCED (GAUZE/BANDAGES/DRESSINGS)
DERMABOND ADVANCED .7 DNX12 (GAUZE/BANDAGES/DRESSINGS) IMPLANT
DRSG OPSITE POSTOP 4X10 (GAUZE/BANDAGES/DRESSINGS) ×2 IMPLANT
ELECT REM PT RETURN 9FT ADLT (ELECTROSURGICAL) ×2
ELECTRODE REM PT RTRN 9FT ADLT (ELECTROSURGICAL) ×1 IMPLANT
EXTRACTOR VACUUM KIWI (MISCELLANEOUS) IMPLANT
GLOVE BIOGEL PI IND STRL 7.0 (GLOVE) ×3 IMPLANT
GLOVE BIOGEL PI INDICATOR 7.0 (GLOVE) ×3
GLOVE ECLIPSE 6.5 STRL STRAW (GLOVE) ×2 IMPLANT
GOWN STRL REUS W/TWL LRG LVL3 (GOWN DISPOSABLE) ×6 IMPLANT
KIT ABG SYR 3ML LUER SLIP (SYRINGE) IMPLANT
NEEDLE HYPO 25X5/8 SAFETYGLIDE (NEEDLE) IMPLANT
NS IRRIG 1000ML POUR BTL (IV SOLUTION) ×2 IMPLANT
PACK C SECTION WH (CUSTOM PROCEDURE TRAY) ×2 IMPLANT
PAD ABD 7.5X8 STRL (GAUZE/BANDAGES/DRESSINGS) ×2 IMPLANT
PAD OB MATERNITY 4.3X12.25 (PERSONAL CARE ITEMS) ×2 IMPLANT
PENCIL SMOKE EVAC W/HOLSTER (ELECTROSURGICAL) ×2 IMPLANT
RTRCTR C-SECT PINK 25CM LRG (MISCELLANEOUS) ×2 IMPLANT
STRIP CLOSURE SKIN 1/2X4 (GAUZE/BANDAGES/DRESSINGS) ×2 IMPLANT
SUT PLAIN 0 NONE (SUTURE) IMPLANT
SUT PLAIN 2 0 XLH (SUTURE) ×2 IMPLANT
SUT VIC AB 0 CT1 27 (SUTURE) ×2
SUT VIC AB 0 CT1 27XBRD ANBCTR (SUTURE) ×2 IMPLANT
SUT VIC AB 0 CTX 36 (SUTURE) ×3
SUT VIC AB 0 CTX36XBRD ANBCTRL (SUTURE) ×3 IMPLANT
SUT VIC AB 2-0 CT1 27 (SUTURE) ×1
SUT VIC AB 2-0 CT1 TAPERPNT 27 (SUTURE) ×1 IMPLANT
SUT VIC AB 4-0 KS 27 (SUTURE) ×2 IMPLANT
TOWEL OR 17X24 6PK STRL BLUE (TOWEL DISPOSABLE) ×2 IMPLANT
TRAY FOLEY W/BAG SLVR 14FR LF (SET/KITS/TRAYS/PACK) IMPLANT
WATER STERILE IRR 1000ML POUR (IV SOLUTION) ×2 IMPLANT

## 2020-06-01 NOTE — Op Note (Addendum)
Ashley Michael PROCEDURE DATE: 06/01/2020  PREOPERATIVE DIAGNOSES: Intrauterine pregnancy at [redacted]w[redacted]d weeks gestation; failed Induction of Labor (arrest of dilation at 4cm despite 32 hours of pitocin and 24 hours s/p AROM)  POSTOPERATIVE DIAGNOSES: The same  PROCEDURE: Primary Low Transverse Cesarean Section  SURGEON:  Dr. Myna Michael  ASSISTANT:  Dr. Lynnda Michael  ANESTHESIOLOGY TEAM: Anesthesiologist: Ashley Silvas, MD; Ashley Curb, MD CRNA: Ashley Greenhouse, CRNA Student Nurse Anesthetist: Ashley Morrow, RN  INDICATIONS: Ashley Michael is a 35 y.o. G1P1001 at [redacted]w[redacted]d here for cesarean section secondary to the indications listed under preoperative diagnoses; please see preoperative note for further details.  The risks of surgery were discussed with the patient including but were not limited to: bleeding which may require transfusion or reoperation; infection which may require antibiotics; injury to bowel, bladder, ureters or other surrounding organs; injury to the fetus; need for additional procedures including hysterectomy in the event of a life-threatening hemorrhage; formation of adhesions; placental abnormalities wth subsequent pregnancies; incisional problems; thromboembolic phenomenon and other postoperative/anesthesia complications.  The patient concurred with the proposed plan, giving informed written consent for the procedure.    FINDINGS:  Viable female infant in cephalic presentation.  Apgars 8 and 9.  Clear amniotic fluid.  Intact placenta, three vessel cord.  Normal uterus, fallopian tubes and ovaries bilaterally.  ANESTHESIA: Epidural  INTRAVENOUS FLUIDS: 1300 ml   ESTIMATED BLOOD LOSS: 278 ml URINE OUTPUT:  50 ml SPECIMENS: Placenta sent to L&D COMPLICATIONS: None immediate  PROCEDURE IN DETAIL:  The patient preoperatively received intravenous antibiotics (ancef, azithromycin) and had sequential compression devices applied to her lower  extremities.  She was then taken to the operating room where the epidural anesthesia was dosed up to surgical level and was found to be adequate. She was then placed in a dorsal supine position with a leftward tilt, and prepped and draped in a sterile manner.  A foley catheter was placed into her bladder and attached to constant gravity.  After an adequate timeout was performed, a Pfannenstiel skin incision was made with scalpel and carried through to the underlying layer of fascia. The fascia was incised in the midline, and this incision was extended bilaterally using the Mayo scissors.  Kocher clamps were applied to the superior aspect of the fascial incision and the underlying rectus muscles were dissected off bluntly and sharply.  A similar process was carried out on the inferior aspect of the fascial incision. The rectus muscles were separated in the midline and the peritoneum was entered bluntly. The Alexis self-retaining retractor was introduced into the abdominal cavity.  Attention was turned to the lower uterine segment where a bladder flap was created sharply and bluntly. A low transverse hysterotomy was then made with a scalpel and extended bilaterally bluntly. The infant was successfully delivered, the cord was clamped and cut after one minute, and the infant was handed over to the awaiting neonatology team. Uterine massage was then administered, and the placenta delivered intact with a three-vessel cord. The uterus was then cleared of clots and debris.  The hysterotomy was closed with 0 Vicryl in a running locked fashion, and a horizontal imbricating layer was also placed with 0 Vicryl. TXA was administered. The pelvis was irrigated and cleared of all clot and debris. Hemostasis was confirmed on all surfaces.  The retractor was removed.  The peritoneum was closed with a 2-0 Vicryl running stitch. The fascia was then closed using 0 Vicryl in a running fashion.  The subcutaneous layer was irrigated and  reapproximated with 2-0 plain gut interrupted stitches, and the skin was closed with a 4-0 Vicryl subcuticular stitch. The patient tolerated the procedure well. Sponge, instrument and needle counts were correct x 3.  She was taken to the recovery room in stable condition.   Ashley Oats, MD OB Fellow, [facility] 06/01/2020 4:06 PM  Attestation of Attending Supervision of Obstetric Fellow: Evaluation and management procedures were performed by the Obstetric Fellow under my supervision and collaboration.  I have reviewed the Obstetric Fellow's note and chart, and I agree with the management and plan. I have also made any necessary editorial changes.   Ashley Seller, DO Attending Obstetrician & Gynecologist, [facility] for [facility], [facility] 06/01/2020 5:28 PM

## 2020-06-01 NOTE — Transfer of Care (Signed)
Immediate Anesthesia Transfer of Care Note  Patient: Ashley Michael  Procedure(s) Performed: CESAREAN SECTION (N/A )  Patient Location: PACU  Anesthesia Type:Spinal  Level of Consciousness: awake  Airway & Oxygen Therapy: Patient Spontanous Breathing  Post-op Assessment: Report given to RN  Post vital signs: Reviewed and stable  Last Vitals:  Vitals Value Taken Time  BP 149/83 06/01/20 1615  Temp 36.7 C 06/01/20 1607  Pulse 82 06/01/20 1621  Resp 18 06/01/20 1621  SpO2 95 % 06/01/20 1621  Vitals shown include unvalidated device data.  Last Pain:  Vitals:   06/01/20 1615  TempSrc:   PainSc: 0-No pain      Patients Stated Pain Goal: 1 (05/31/20 1658)  Complications: No complications documented.

## 2020-06-01 NOTE — Progress Notes (Addendum)
Labor Progress Note Ashley Michael is a 35 y.o. G1P0000 at [redacted]w[redacted]d presented for IOL secondary to gHTN.  S: No concerns per patient. Pt disappointed but understanding of lack of cervical change and recommendation for Cesarean at this time as noted below.  O:  BP 117/64   Pulse 66   Temp 99.3 F (37.4 C) (Oral)   Resp 18   Ht 5\' 2"  (1.575 m)   Wt 84 kg   LMP 09/01/2019   BMI 33.86 kg/m  EFM: baseline 135/moderate variability/+accels/no decels  CVE: Dilation: 4 Effacement (%): 80 Station: 0 Presentation: Vertex Exam by:: Dr. [email or web address]   A&P: 35 y.o. G1P0000 [redacted]w[redacted]d presented for IOL secondary to gHTN. #Labor: Lack of cervical change for over 24 hours s/p AROM; contractions continue to be inadequate despite pitocin since 0600 on 05/31/20. Pt also had a 4 hour pitocin break overnight with restart of high dose pitocin this morning at 0700. Given failed induction, pt consented for primary Cesarean as noted below.  The risks of cesarean section were discussed with the patient including but were not limited to: bleeding which may require transfusion or reoperation; infection which may require antibiotics; injury to bowel, bladder, ureters or other surrounding organs; injury to the fetus; need for additional procedures including hysterectomy in the event of a life-threatening hemorrhage; placental abnormalities wth subsequent pregnancies, incisional problems, thromboembolic phenomenon and other postoperative/anesthesia complications.  The patient concurred with the proposed plan, giving informed written consent for the procedures.  Patient has been NPO since yesterday; she will remain NPO for procedure. Anesthesia and OR aware. Preoperative prophylactic antibiotics (ancef & azithro) and SCDs ordered on call to the OR.  To OR when ready.  #Pain: epidural in place #FWB: Category 1 strip #GBS positive; s/p adequate penicillin; ancef and azithromycin now ordered in prep for OR #gHTN: pt  asymptomatic with unremarkable preeclampsia labs on admission. Most recently blood pressure wnl. Will continue to monitor. #Rh negative: plan for rhogam workup s/p delivery  06/02/20, MD 2:38 PM   At bedside and reviewed plan of C-section including risk/benefit as outlined above.  Inform consent obtained. Attestation of Attending Supervision of Obstetric Fellow: Evaluation and management procedures were performed by the Obstetric Fellow under my supervision and collaboration.  I have reviewed the Obstetric Fellow's note and chart, and I agree with the management and plan. I have also made any necessary editorial changes.   Sheila Oats, DO Attending Obstetrician & Gynecologist, [facility] for [facility], [facility] 06/01/2020 4:01 PM

## 2020-06-01 NOTE — Discharge Summary (Addendum)
Postpartum Discharge Summary     Patient Name: Ashley Michael DOB: Nov 22, 1985 MRN: [id number]  Date of admission: 05/30/2020 Delivery date:06/01/2020  Delivering provider: Janyth Pupa  Date of discharge: 06/03/2020  Admitting diagnosis: Encounter for induction of labor [Z34.90] Intrauterine pregnancy: [redacted]w[redacted]d    Secondary diagnosis:  Principal Problem:   Cesarean delivery delivered Active Problems:   Current smoker   Family history of depression   Dysthymia   Positive GBS test   Gestational hypertension   Encounter for induction of labor   Rh negative state in antepartum period   Acute blood loss anemia  Additional problems: as noted above Discharge diagnosis: Cesarean delivery delivered                                           Post partum procedures:none Augmentation: AROM, Pitocin, Cytotec and IP Foley Complications: failed IOL (arrest of dilation at 4cm)  Hospital course: Induction of Labor With Cesarean Section   35y.o. yo G1P1001 at 342w1das admitted to the hospital 05/30/2020 for induction of labor secondary to gHTN. Patient had a labor course significant for arrest of dilation at 4cm despite 32 hours of pitocin and 24 hours s/p AROM. The patient went for cesarean section due to failed IOL. Delivery details are as follows: Membrane Rupture Time/Date: 2:59 PM ,05/31/2020   Delivery Method:C-Section, Low Transverse  Details of operation can be found in separate operative Note.  Patient had an uncomplicated postpartum course. She is ambulating, tolerating a regular diet, passing flatus, and urinating well.  Patient is discharged home in stable condition on 06/03/20.      Newborn Data: Birth date:06/01/2020  Birth time:3:19 PM  Gender:Female  Living status:Living  Apgars:8 ,10  Weight:3019 g                                 Magnesium Sulfate received: No BMZ received: No Rhophylac:Yes MMR:N/A T-DaP:Given prenatally Flu: N/A offered prior to  discharge Transfusion:No  Physical exam  Vitals:   06/02/20 1400 06/02/20 1935 06/02/20 2009 06/03/20 0636  BP: 130/86 112/75 134/77 (!) 100/56  Pulse: 63 83 (!) 58 (!) 54  Resp: _0 Temp: 98.2 F (36.8 C) (!) 97.4 F (36.3 C) 98 F (36.7 C) 98.2 F (36.8 C)  TempSrc: Oral Oral Oral Oral  SpO2:  97% 98%   Weight:      Height:       General: alert and no distress Lochia: appropriate Uterine Fundus: firm Incision: Healing well with no significant drainage, No significant erythema, Dressing is clean, dry, and intact DVT Evaluation: No evidence of DVT seen on physical exam. Labs: Lab Results  Component Value Date   WBC 15.5 (H) 06/02/2020   HGB 8.3 (L) 06/02/2020   HCT 24.7 (L) 06/02/2020   MCV 84.3 06/02/2020   PLT 178 06/02/2020   CMP Latest Ref Rng & Units 05/30/2020  Glucose 70 - 99 mg/dL 89  BUN 6 - 20 mg/dL <5(L)  Creatinine 0.44 - 1.00 mg/dL 0.52  Sodium 135 - 145 mmol/L 135  Potassium 3.5 - 5.1 mmol/L 3.0(L)  Chloride 98 - 111 mmol/L 103  CO2 22 - 32 mmol/L 25  Calcium 8.9 - 10.3 mg/dL 8.4(L)  Total Protein 6.5 - 8.1 g/dL 5.8(L)  Total Bilirubin  0.3 - 1.2 mg/dL 0.3  Alkaline Phos 38 - 126 U/L 106  AST 15 - 41 U/L 14(L)  ALT 0 - 44 U/L 10   Edinburgh Score: Edinburgh Postnatal Depression Scale Screening Tool 06/02/2020  I have been able to laugh and see the funny side of things. 0  I have looked forward with enjoyment to things. 0  I have blamed myself unnecessarily when things went wrong. 2  I have been anxious or worried for no good reason. 1  I have felt scared or panicky for no good reason. 2  Things have been getting on top of me. 0  I have been so unhappy that I have had difficulty sleeping. 0  I have felt sad or miserable. 1  I have been so unhappy that I have been crying. 1  The thought of harming myself has occurred to me. 0  Edinburgh Postnatal Depression Scale Total 7     After visit meds:  Allergies as of 06/03/2020   No Known  Allergies     Medication List    TAKE these medications   acetaminophen 325 MG tablet Commonly known as: TYLENOL Take 2 tablets (650 mg total) by mouth every 4 (four) hours as needed for mild pain (temperature > 101.5.). What changed:   medication strength  how much to take  when to take this  reasons to take this   calcium carbonate 500 MG chewable tablet Commonly known as: TUMS - dosed in mg elemental calcium Chew 1 tablet by mouth 2 (two) times daily as needed for indigestion or heartburn.   ferrous sulfate 325 (65 FE) MG tablet Take 1 tablet (325 mg total) by mouth every other day. Start taking on: June 04, 2020   ibuprofen 600 MG tablet Commonly known as: ADVIL Take 1 tablet (600 mg total) by mouth every 6 (six) hours.   oxyCODONE 5 MG immediate release tablet Commonly known as: Oxy IR/ROXICODONE Take 1-2 tablets (5-10 mg total) by mouth every 4 (four) hours as needed for moderate pain.   prenatal vitamin w/FE, FA 27-1 MG Tabs tablet Take 1 tablet by mouth daily at 12 noon.   senna-docusate 8.6-50 MG tablet Commonly known as: Senokot-S Take 2 tablets by mouth daily.        Discharge home in stable condition Infant Feeding: No evidence of DVT seen on physical exam. Infant Disposition:home with mother Discharge instruction: per After Visit Summary and Postpartum booklet. Activity: Advance as tolerated. Pelvic rest for 6 weeks.  Diet: routine diet Future Appointments: Future Appointments  Date Time Provider [place]  06/07/2020  9:35 AM Marsala, Placido Sou, MD [facility]   Follow up Visit: Message sent to [facility] by Dr. Astrid Drafts  Please schedule this patient for a In person postpartum visit in 6 weeks with the following provider: Any provider. Additional Postpartum F/U:Postpartum Depression checkup 2 weeks, Incision check 1 week and BP check 1 week  High risk pregnancy complicated by: gHTN, Rh negative status, dysthymia, smoker, family h/o postpartum  psychosis (pt's mother) Delivery mode:  C-Section, Low Transverse  Anticipated Birth Control:  Unsure   [id number]/61/4709 Arrie Senate, MD

## 2020-06-01 NOTE — Anesthesia Postprocedure Evaluation (Signed)
Anesthesia Post Note  Patient: Ashley Michael  Procedure(s) Performed: CESAREAN SECTION (N/A )     Patient location during evaluation: PACU Anesthesia Type: Epidural Level of consciousness: awake and alert Pain management: pain level controlled Vital Signs Assessment: post-procedure vital signs reviewed and stable Respiratory status: spontaneous breathing, nonlabored ventilation and respiratory function stable Cardiovascular status: blood pressure returned to baseline and stable Postop Assessment: no apparent nausea or vomiting Anesthetic complications: no   No complications documented.  Last Vitals:  Vitals:   06/01/20 1700 06/01/20 1722  BP: 132/77 139/78  Pulse: 65 64  Resp: 18 16  Temp:  37 C  SpO2: 94% 96%    Last Pain:  Vitals:   06/01/20 1722  TempSrc: Oral  PainSc:    Pain Goal: Patients Stated Pain Goal: 1 (05/31/20 1658)  LLE Motor Response: Purposeful movement (06/01/20 1700)   RLE Motor Response: Purposeful movement (06/01/20 1700)       Epidural/Spinal Function Cutaneous sensation: Able to Wiggle Toes (06/01/20 1700), Patient able to flex knees: Yes (06/01/20 1700), Patient able to lift hips off bed: Yes (06/01/20 1700), Back pain beyond tenderness at insertion site: No (06/01/20 1700), Progressively worsening motor and/or sensory loss: No (06/01/20 1700), Bowel and/or bladder incontinence post epidural: No (06/01/20 1700)  Lowella Curb

## 2020-06-01 NOTE — Progress Notes (Signed)
Patient ID: [name], female   DOB: 05-Jan-1986, 35 y.o.   MRN: [id number]  Stopped Pitocin from 0330-0700; now going again at high dose; she is feeling well with her epidural  BP 133/69, P 72 FHR 140s, +accels, no decels Ctx irreg with Pit at 25mu/min Cx was unchanged on exam (4/50/-2)  IUP@39 .1wks gHTN IOL process  Continue to increase Pit per high dose protocol to achieve adequate MVUs Hopeful for vag delivery  Ashley Michael [facility] 06/01/2020 7:52 AM

## 2020-06-01 NOTE — Progress Notes (Addendum)
Ashley Michael is a 35 y.o. G1P0000 at [redacted]w[redacted]d by LMP admitted for induction of labor due to gestational hypertension.  Subjective: Patient feeling well  Objective: BP 130/69   Pulse 68   Temp 98.3 F (36.8 C)   Resp 16   Ht 5\' 2"  (1.575 m)   Wt 84 kg   LMP 09/01/2019   BMI 33.86 kg/m  No intake/output data recorded. No intake/output data recorded.  FHT:  FHR: 140 bpm, variability: moderate,  accelerations:  Present,  decelerations:  Absent UC:   regular, every 3 minutes SVE:   Dilation: 4 Effacement (%): 50 Station: -2 Exam by:: Dr [email or web address]  Labs: Lab Results  Component Value Date   WBC 11.4 (H) 05/31/2020   HGB 9.2 (L) 05/31/2020   HCT 28.6 (L) 05/31/2020   MCV 85.1 05/31/2020   PLT 199 05/31/2020   Assessment / Plan: Induction of labor due to gestational hypertension,  minimal progress on pitocin  Labor: Minimal progression, IUPC replaced since fell out earlier Preeclampsia:  labs stable Fetal Wellbeing:  Category I Pain Control:  Epidural I/D:  GBS positive Anticipated MOD:  NSVD  06/02/2020 Ashley Michael 06/01/2020, 2:55 AM

## 2020-06-02 ENCOUNTER — Other Ambulatory Visit: Payer: Self-pay | Admitting: Obstetrics and Gynecology

## 2020-06-02 LAB — CBC
HCT: 24.7 % — ABNORMAL LOW (ref 36.0–46.0)
Hemoglobin: 8.3 g/dL — ABNORMAL LOW (ref 12.0–15.0)
MCH: 28.3 pg (ref 26.0–34.0)
MCHC: 33.6 g/dL (ref 30.0–36.0)
MCV: 84.3 fL (ref 80.0–100.0)
Platelets: 178 10*3/uL (ref 150–400)
RBC: 2.93 MIL/uL — ABNORMAL LOW (ref 3.87–5.11)
RDW: 13.3 % (ref 11.5–15.5)
WBC: 15.5 10*3/uL — ABNORMAL HIGH (ref 4.0–10.5)
nRBC: 0 % (ref 0.0–0.2)

## 2020-06-02 MED ORDER — AMLODIPINE BESYLATE 5 MG PO TABS: 5.0000 mg | ORAL_TABLET | Freq: Every day | ORAL | 0 refills | Status: DC

## 2020-06-02 MED ORDER — IBUPROFEN 600 MG PO TABS: 600.0000 mg | ORAL_TABLET | Freq: Four times a day (QID) | ORAL | 0 refills | Status: DC

## 2020-06-02 MED ORDER — OXYCODONE HCL 5 MG PO TABS: 5.0000 mg | ORAL_TABLET | ORAL | 0 refills | Status: DC | PRN

## 2020-06-02 MED ORDER — SENNOSIDES-DOCUSATE SODIUM 8.6-50 MG PO TABS: 2.0000 | ORAL_TABLET | Freq: Every day | ORAL | 0 refills | Status: AC

## 2020-06-02 MED ORDER — ACETAMINOPHEN 325 MG PO TABS: 650.0000 mg | ORAL_TABLET | ORAL | 0 refills | Status: DC | PRN

## 2020-06-02 MED ORDER — ASCORBIC ACID 500 MG PO TABS
250.0000 mg | ORAL_TABLET | ORAL | Status: DC
  Administered 2020-06-02: 250 mg via ORAL
  Filled 2020-06-02: qty 1

## 2020-06-02 MED ORDER — FERROUS SULFATE 325 (65 FE) MG PO TABS
325.0000 mg | ORAL_TABLET | ORAL | Status: DC
  Administered 2020-06-02: 325 mg via ORAL
  Filled 2020-06-02: qty 1

## 2020-06-02 MED ORDER — RHO D IMMUNE GLOBULIN 1500 UNIT/2ML IJ SOSY
300.0000 ug | PREFILLED_SYRINGE | Freq: Once | INTRAMUSCULAR | Status: AC
  Administered 2020-06-02: 300 ug via INTRAVENOUS
  Filled 2020-06-02: qty 2

## 2020-06-02 MED ORDER — FERROUS SULFATE 325 (65 FE) MG PO TABS: 325.0000 mg | ORAL_TABLET | ORAL | 3 refills | Status: DC

## 2020-06-02 MED ORDER — AMLODIPINE BESYLATE 5 MG PO TABS
5.0000 mg | ORAL_TABLET | Freq: Every day | ORAL | Status: DC
  Administered 2020-06-02 – 2020-06-03 (×2): 5 mg via ORAL
  Filled 2020-06-02 (×2): qty 1

## 2020-06-02 MED FILL — oxyCODONE HCL 5 MG TABS: 5 | 3 days supply | Qty: 15 | Fill #0

## 2020-06-02 MED FILL — FERROUS SULFATE 325 MG TAB: 325 (65 FE) | 30 days supply | Qty: 15 | Fill #0

## 2020-06-02 MED FILL — AMLODIPINE BESYLATE 5 MG TA: 5 | 30 days supply | Qty: 30 | Fill #0

## 2020-06-02 MED FILL — SENEXON-S 8.6-50 MG TABS: 8.6-50 | 15 days supply | Qty: 30 | Fill #0

## 2020-06-02 MED FILL — IBUPROFEN 600 MG TABLET: 600 | 8 days supply | Qty: 30 | Fill #0

## 2020-06-02 MED FILL — ACETAMINOPHEN 325 MG TABS: 325 | 4 days supply | Qty: 30 | Fill #0

## 2020-06-02 NOTE — Progress Notes (Signed)
Patient rating pain 8 out of 10. This RN attempted to give pt two 5 mg oxycodone tablets. Handed pt the meds in a cup and watched as pt took one of the tablets out of her mouth and placed under blanket. Waited to ensure pt hadn't dropped medication accidentally, but pt did not attempt to locate the pill. This RN proceeded to ask pt what she intended to do with the pill and she stated "I'm going to save it for later." Informed the pt that she was not allowed to do that and that I needed to properly waste the med. Pt willing gave this RN the medication. This RN disposed of the pill with Sherri Sear, RN.

## 2020-06-02 NOTE — Lactation Note (Signed)
This note was copied from a baby's chart. Lactation Consultation Note  Patient Name: Ashley Michael [id number] Date: 06/02/2020 Reason for consult: Follow-up assessment Age:33 hours P1,term female infant. One void and 2 stools. Per mom, breastfeeding is going very well and she only feels a tug when infant is latched.   Infant is breastfeeding both breast for 15 to 20 minutes each. LC did not observe latch due infant feeding less than 2 hours and asleep at this time. Mom was given a hand pump and she plans to stay home will not be returning to work.  Mom did ask about when to use pacifier, LC explained to wait 3 weeks pp  when infant is older and mom's milk supply is established.  Mom felt LC answered all her questions.  Maternal Data    Feeding Mother's Current Feeding Choice: Breast Milk  LATCH Score Latch: Repeated attempts needed to sustain latch, nipple held in mouth throughout feeding, stimulation needed to elicit sucking reflex.  Audible Swallowing: A few with stimulation  Type of Nipple: Everted at rest and after stimulation  Comfort (Breast/Nipple): Soft / non-tender  Hold (Positioning): Assistance needed to correctly position infant at breast and maintain latch.  LATCH Score: 7   Lactation Tools Discussed/Used    Interventions Interventions: Skin to skin;Hand express;Breast massage;Position options;Education  Discharge    Consult Status Consult Status: Follow-up Date: 06/03/20 Follow-up type: In-patient    Ashley Michael 06/02/2020, 5:58 PM

## 2020-06-02 NOTE — Social Work (Signed)
CSW received consult that MOB has a family history of Dysthymia and Postpartum. CSW met with MOB to offer support and complete assessment.    CSW met with the MOB at bedside. CSW introduced role and congratulated MOB and FOB.CSW observed MOB breast feeding the infant. FOB present at the time and was on a call with a relative. CSW explain the privacy and HIPPA policy and offered it to MOB. MOB agreeable for the FOB to stay and the relative was ok to stay on the call with FOB. CSW asked MOB how she feels since giving birth. MOB reports, " Awesome, It's a good time." CSW asked MOB if she is having in thoughts of suicide or homicide. MOB denies thoughts of suicide and homicide.  CSW asked MOB about her pregnancy experience. MOB reports during the first half of her pregnancy it was rough. She reports at the time she lived in [place], [place] and felt anxious about her living environment. MOB reports the job market is bad, there is a lot of crime and just nothing to look forward to. MOB reports she was concern about her wellbeing and the infants. MOB reports she moved to the area about 7 months ago. MOB it's been a great experience so far. MOB reports they are currently living with her dad, who has been supportive during her pregnancy.  CSW asked MOB if she has concerns about her mental health. MOB reports she has not been official diagnosed but feels she may have anxiety. MOB reports she also witness her mother go through postpartum depression with her younger sibling. MOB does have concern about possibly having postpartum as well. CSW recommended MOB complete a self-evaluation during the postpartum time period using the New Mom Checklist from Postpartum Progress and encouraged MOB to contact a medical professional if symptoms are noted at any time. FOB agreeable to watch for symptoms as well. CSW provided education regarding the baby blues period vs. perinatal mood disorders, discussed treatment and gave resources for  mental health follow up if concerns arise.  MOB appreciative of the resources and agreeable to follow up with a therapist that offers telehealth visits. CSW asked MOB about her supports, other than FOB. MOB reports her dad, dad's wife, sister and Jesus.  CSW provided review of Sudden Infant Death Syndrome (SIDS) precautions and no-sleeping with the infant. MOB was able to teach back the precautions that she had learned. CSW asked MOB about items for the infant. MOB reports the baby has items including a car seat and a crib. MOB reports she has [facility] however she is in the process of changing her [facility] services to [facility]. CSW provided MOB with the contact information for [facility] office.  MOB asked if they offered phone appointments. CSW informed MOB they are currently only offering phone appointments. MOB very appreciative of CSW interventions.  CSW assessed for additional needs.   CSW identifies no further need for intervention and no barriers to discharge at this time.  Kathrin Greathouse, MSW, LCSW Women's and [place] Worker  [phone] 06/02/2020  11:50 AM

## 2020-06-02 NOTE — Lactation Note (Signed)
This note was copied from a baby's chart. Lactation Consultation Note Baby is 53 hrs old. Mom awake holding baby STS. Mom stated she hadn't been long BF. Mom stated BF going great that baby is BF like a champ. Mom denies painful latch.  Newborn feeding habits, STS, I&O, pumping, newborn behavior, supply and demand discussed. Mom encouraged to feed baby 8-12 times/24 hours and with feeding cues.   Mom has good everted nipples. Mom demonstrated hand expression w/colostrum pouring out. Hand pump given d/t mom doesn't have pump at home.  Encouraged mom to call for questions or assistance. Lactation brochure given.  Patient Name: Ashley Michael [id number] Date: 06/02/2020 Reason for consult: Initial assessment;[place];Term Age:16 hours  Maternal Data Has patient been taught Hand Expression?: Yes Does the patient have breastfeeding experience prior to this delivery?: No  Feeding    LATCH Score       Type of Nipple: Everted at rest and after stimulation  Comfort (Breast/Nipple): Soft / non-tender         Lactation Tools Discussed/Used Tools: Pump Breast pump type: Manual Pump Education: Setup, frequency, and cleaning;Milk Storage Reason for Pumping: mom doesn't have a pump  Interventions Interventions: Breast feeding basics reviewed;Support pillows;Skin to skin;Breast massage;Hand express;Hand pump  Discharge WIC Program: No  Consult Status Consult Status: Follow-up Date: 06/03/20 Follow-up type: In-patient    Charyl Dancer 06/02/2020, 5:23 AM

## 2020-06-02 NOTE — Progress Notes (Addendum)
POSTPARTUM PROGRESS NOTE  POD #1  Subjective:  Ashley Michael is a 35 y.o. G1P1001 s/p pLTCS at [redacted]w[redacted]d.  She reports she doing well. No acute events overnight. She denies any problems with ambulating or po intake.  Patient still has Foley in place.  Denies nausea or vomiting. She has not passed flatus. Pain is moderately controlled.  Lochia is mild.  Objective: Blood pressure (!) 144/66, pulse 73, temperature 98 F (36.7 C), temperature source Oral, resp. rate 18, height 5\' 2"  (1.575 m), weight 84 kg, last menstrual period 09/01/2019, SpO2 97 %, unknown if currently breastfeeding.  Physical Exam:  General: alert, cooperative and no distress Chest: no respiratory distress Heart:regular rate, distal pulses intact Abdomen: soft, nontender,  Uterine Fundus: firm, appropriately tender DVT Evaluation: No calf swelling or tenderness Extremities: Trace edema Skin: warm, dry; incision covered and honeycomb.  Semiclean.  Not draining or bleeding at this time.   Recent Labs    05/30/20 1543 05/31/20 0725  HGB 9.9* 9.2*  HCT 30.3* 28.6*    Assessment/Plan: Tegan Britain is a 35 y.o. G1P1001 POD#1 pLTCS  at [redacted]w[redacted]d for failure to progress and nonreassuring fetal heart tones.  POD#1 - Doing welll; pain moderatley well controlled. H/H appropriate  Routine postpartum care  OOB, ambulated  Lovenox for VTE prophylaxis Anemia: asymptomatic Mild range blood pressure this morning.  Will initiate Norvasc 5 mg daily.  Contraception: NFP and condoms  Feeding:breast   Dispo: Plan for discharge tomorrow or the next day.   LOS: 3 days   [redacted]w[redacted]d, MD  PGY-2, [facility]  06/02/2020, 6:45 AM   GME ATTESTATION:  I saw and evaluated the patient. I agree with the findings and the plan of care as documented in the resident's note.  06/04/2020, MD OB Fellow, Faculty [facility], Center for [facility] 06/02/2020 8:03 AM

## 2020-06-03 DIAGNOSIS — D62 Acute posthemorrhagic anemia: Secondary | ICD-10-CM | POA: Diagnosis not present

## 2020-06-03 LAB — RH IG WORKUP (INCLUDES ABO/RH)
ABO/RH(D): A NEG
Fetal Screen: NEGATIVE
Gestational Age(Wks): 39
Unit division: 0

## 2020-06-03 MED ORDER — ACETAMINOPHEN 500 MG PO TABS: 1000.0000 mg | ORAL_TABLET | Freq: Four times a day (QID) | ORAL | Status: DC | PRN

## 2020-06-03 MED ORDER — IBUPROFEN 600 MG PO TABS: 600.0000 mg | ORAL_TABLET | Freq: Four times a day (QID) | ORAL | 0 refills | Status: AC | PRN

## 2020-06-03 MED ORDER — OXYCODONE HCL 5 MG PO TABS: 5.0000 mg | ORAL_TABLET | ORAL | 0 refills | Status: DC | PRN

## 2020-06-03 MED ORDER — ASCORBIC ACID 250 MG PO TABS: 250.0000 mg | ORAL_TABLET | ORAL | Status: DC

## 2020-06-03 MED ORDER — FERROUS SULFATE 325 (65 FE) MG PO TABS: 325.0000 mg | ORAL_TABLET | ORAL | 3 refills | Status: AC

## 2020-06-03 NOTE — Discharge Instructions (Signed)
-take tylenol 1000 mg every 6 hours as needed for pain, alternate with ibuprofen 600 mg every 6 hours -take oxycodone as needed if tylenol and ibuprofen aren't working -drink plenty of water to help with breastfeeding -continue prenatal vitamins while you are breastfeeding -take iron pills every other day with vitamin c, this will help healing as well as breast feeding -think about birth control options-->bedisider.org is a great website! You can get any form of birth control from the health department for free   Postpartum Care After Cesarean Delivery This sheet gives you information about how to care for yourself from the time you deliver your baby to up to 6-12 weeks after delivery (postpartum period). Your health care provider may also give you more specific instructions. If you have problems or questions, contact your health care provider. Follow these instructions at home: Medicines  Take over-the-counter and prescription medicines only as told by your health care provider.  If you were prescribed an antibiotic medicine, take it as told by your health care provider. Do not stop taking the antibiotic even if you start to feel better.  Ask your health care provider if the medicine prescribed to you: ? Requires you to avoid driving or using heavy machinery. ? Can cause constipation. You may need to take actions to prevent or treat constipation, such as:  Drink enough fluid to keep your urine pale yellow.  Take over-the-counter or prescription medicines.  Eat foods that are high in fiber, such as beans, whole grains, and fresh fruits and vegetables.  Limit foods that are high in fat and processed sugars, such as fried or sweet foods. Activity  Gradually return to your normal activities as told by your health care provider.  Avoid activities that take a lot of effort and energy (are strenuous) until approved by your health care provider. Walking at a slow to moderate pace is usually  safe. Ask your health care provider what activities are safe for you. ? Do not lift anything that is heavier than your baby or 10 lb (4.5 kg) as told by your health care provider. ? Do not vacuum, climb stairs, or drive a car for as long as told by your health care provider.  If possible, have someone help you at home until you are able to do your usual activities yourself.  Rest as much as possible. Try to rest or take naps while your baby is sleeping. Vaginal bleeding  It is normal to have vaginal bleeding (lochia) after delivery. Wear a sanitary pad to absorb vaginal bleeding and discharge. ? During the first week after delivery, the amount and appearance of lochia is often similar to a menstrual period. ? Over the next few weeks, it will gradually decrease to a dry, yellow-brown discharge. ? For most women, lochia stops completely by 4-6 weeks after delivery. Vaginal bleeding can vary from woman to woman.  Change your sanitary pads frequently. Watch for any changes in your flow, such as: ? A sudden increase in volume. ? A change in color. ? Large blood clots.  If you pass a blood clot, save it and call your health care provider to discuss. Do not flush blood clots down the toilet before you get instructions from your health care provider.  Do not use tampons or douches until your health care provider says this is safe.  If you are not breastfeeding, your period should return 6-8 weeks after delivery. If you are breastfeeding, your period may return anytime between 8   weeks after delivery and the time that you stop breastfeeding. Perineal care  If your C-section (Cesarean section) was unplanned, and you were allowed to labor and push before delivery, you may have pain, swelling, and discomfort of the tissue between your vaginal opening and your anus (perineum). You may also have an incision in the tissue (episiotomy) or the tissue may have torn during delivery. Follow these instructions  as told by your health care provider: ? Keep your perineum clean and dry as told by your health care provider. Use medicated pads and pain-relieving sprays and creams as directed. ? If you have an episiotomy or vaginal tear, check the area every day for signs of infection. Check for:  Redness, swelling, or pain.  Fluid or blood.  Warmth.  Pus or a bad smell. ? You may be given a squirt bottle to use instead of wiping to clean the perineum area after you go to the bathroom. As you start healing, you may use the squirt bottle before wiping yourself. Make sure to wipe gently. ? To relieve pain caused by an episiotomy, vaginal tear, or hemorrhoids, try taking a warm sitz bath 2-3 times a day. A sitz bath is a warm water bath that is taken while you are sitting down. The water should only come up to your hips and should cover your buttocks.   Breast care  Within the first few days after delivery, your breasts may feel heavy, full, and uncomfortable (breast engorgement). You may also have milk leaking from your breasts. Your health care provider can suggest ways to help relieve breast discomfort. Breast engorgement should go away within a few days.  If you are breastfeeding: ? Wear a bra that supports your breasts and fits you well. ? Keep your nipples clean and dry. Apply creams and ointments as told by your health care provider. ? You may need to use breast pads to absorb milk leakage. ? You may have uterine contractions every time you breastfeed for several weeks after delivery. Uterine contractions help your uterus return to its normal size. ? If you have any problems with breastfeeding, work with your health care provider or a lactation consultant.  If you are not breastfeeding: ? Avoid touching your breasts as this can make your breasts produce more milk. ? Wear a well-fitting bra and use cold packs to help with swelling. ? Do not squeeze out (express) milk. This causes you to make more  milk. Intimacy and sexuality  Ask your health care provider when you can engage in sexual activity. This may depend on your: ? Risk of infection. ? Healing rate. ? Comfort and desire to engage in sexual activity.  You are able to get pregnant after delivery, even if you have not had your period. If desired, talk with your health care provider about methods of family planning or birth control (contraception). Lifestyle  Do not use any products that contain nicotine or tobacco, such as cigarettes, e-cigarettes, and chewing tobacco. If you need help quitting, ask your health care provider.  Do not drink alcohol, especially if you are breastfeeding. Eating and drinking  Drink enough fluid to keep your urine pale yellow.  Eat high-fiber foods every day. These may help prevent or relieve constipation. High-fiber foods include: ? Whole grain cereals and breads. ? Brown rice. ? Beans. ? Fresh fruits and vegetables.  Take your prenatal vitamins until your postpartum checkup or until your health care provider tells you it is okay to stop.     General instructions  Keep all follow-up visits for you and your baby as told by your health care provider. Most women visit their health care provider for a postpartum checkup within the first 3-6 weeks after delivery. Contact a health care provider if you:  Feel unable to cope with the changes that a new baby brings to your life, and these feelings do not go away.  Feel unusually sad or worried.  Have breasts that are painful, hard, or turn red.  Have a fever.  Have trouble holding urine or keeping urine from leaking.  Have little or no interest in activities you used to enjoy.  Have not breastfed at all and you have not had a menstrual period for 12 weeks after delivery.  Have stopped breastfeeding and you have not had a menstrual period for 12 weeks after you stopped breastfeeding.  Have questions about caring for yourself or your  baby.  Pass a blood clot from your vagina. Get help right away if you:  Have chest pain.  Have difficulty breathing.  Have sudden, severe leg pain.  Have severe pain or cramping in your abdomen.  Bleed from your vagina so much that you fill more than one sanitary pad in one hour. Bleeding should not be heavier than your heaviest period.  Develop a severe headache.  Faint.  Have blurred vision or spots in your vision.  Have a bad-smelling vaginal discharge.  Have thoughts about hurting yourself or your baby. If you ever feel like you may hurt yourself or others, or have thoughts about taking your own life, get help right away. You can go to your nearest emergency department or call:  Your local emergency services (911 in the [place]  A suicide crisis helpline, such as the National Suicide Prevention Lifeline at 1-800-273-8255. This is open 24 hours a day. Summary  The period of time from when you deliver your baby to up to 6-12 weeks after delivery is called the postpartum period.  Gradually return to your normal activities as told by your health care provider.  Keep all follow-up visits for you and your baby as told by your health care provider. This information is not intended to replace advice given to you by your health care provider. Make sure you discuss any questions you have with your health care provider. Document Revised: 10/15/2017 Document Reviewed: 10/15/2017 Elsevier Patient Education  2021 Elsevier Inc.  

## 2020-06-03 NOTE — Lactation Note (Signed)
This note was copied from a baby's chart. Lactation Consultation Note  Patient Name: Ashley Michael KGMWN'U Date: 06/03/2020 Reason for consult: Follow-up assessment;Term;Primapara;1st time breastfeeding;Infant weight loss Age:33 hours / P 1 , early D/C  As LC entered the room baby latched without pillow support.  LC added pillows , and checked latch , upper lip flipped under.  LC offered to assist and mom receptive. LC flipped upper lip to flanged position and support and increased swallows noted. Baby fed 18 mins , mom comfortable. See below for d/c teaching.  Mom has the [facility] brochure with resource numbers.   Maternal Data    Feeding Mother's Current Feeding Choice: Breast Milk  LATCH Score Latch:  (latched with depth)  Audible Swallowing:  (swallows increased after flipping upper lip to increase depth)  Type of Nipple:  (nipple well rounded when baby released)  Comfort (Breast/Nipple):  (per mom comfortable)  Hold (Positioning):  (mom latched her baby by herself)      Lactation Tools Discussed/Used Pump Education: Milk Storage  Interventions Interventions: Breast feeding basics reviewed  Discharge Discharge Education: Engorgement and breast care;Warning signs for feeding baby Pump: Manual;DEBP;Personal  Consult Status Consult Status: Complete Date: 06/03/20    Ashley Michael 06/03/2020, 1:00 PM

## 2020-06-07 ENCOUNTER — Encounter: Payer: Medicaid Other | Admitting: Obstetrics and Gynecology

## 2020-06-08 NOTE — BH Specialist Note (Signed)
Integrated Behavioral Health via Telemedicine Visit  06/08/2020 Ashley Michael [id number]  Number of Integrated Behavioral Health visits: 1 Session Start time: 10:06  Session End time: 10:36 Total time: 30  Referring Provider: Myna Hidalgo, DO Patient/Family location: [facility] Provider location: [facility]  All persons participating in visit: Patient Ashley Michael and [facility] Ashley Michael   Types of Service: Individual psychotherapy and Video visit  I connected with Ashley Michael and/or Ashley Belling Poer's newborn daughter via  Telephone or Video Enabled Telemedicine Application  (Video is Caregility application) and verified that I am speaking with the correct person using two identifiers. Discussed confidentiality: Yes   I discussed the limitations of telemedicine and the availability of in person appointments.  Discussed there is a possibility of technology failure and discussed alternative modes of communication if that failure occurs.  I discussed that engaging in this telemedicine visit, they consent to the provision of behavioral healthcare and the services will be billed under their insurance.  Patient and/or legal guardian expressed understanding and consented to Telemedicine visit: Yes   Presenting Concerns: Patient and/or family reports the following symptoms/concerns: Pt states her primary symptoms are fatigue and adjusting to new sleep schedule; sleeping when baby sleeps, eating small meals throughout the day while breastfeeding, accepting help from supportive family as needed. Pt would like to prevent depression postpartum, as her mother experienced.  Duration of problem: Postpartum; Severity of problem: mild  Patient and/or Family's Strengths/Protective Factors: Social connections, Concrete supports in place (healthy food, safe environments, etc.), Sense of purpose and Physical Health  (exercise, healthy diet, medication compliance, etc.)  Goals Addressed: Patient will: 1.  Maintain reduction of  symptoms of: depression  2.  Increase knowledge and/or ability of: healthy habits  3.  Demonstrate ability to: Increase healthy adjustment to current life circumstances  Progress towards Goals: Ongoing  Interventions: Interventions utilized:  Solution-Focused Strategies, Psychoeducation and/or Health Education and Link to [employer] Standardized Assessments completed: GAD-7 and PHQ 9  Patient and/or Family Response: Pt agrees to treatment plan  Assessment: Patient currently experiencing At risk for depression.   Patient may benefit from psychoeducation and brief therapeutic interventions regarding adjusting to new motherhood .  Plan: 1. Follow up with behavioral health clinician on : One month; Call Ashley Michael as needed prior to scheduled visit at [phone] 2. Behavioral recommendations:  -Continue taking prenatal vitamin and iron pill as recommended by medical provider -Continue sleeping when baby sleeps, eating meals when hungry, and accepting help from family as needed -Consider registering for new mom online support group at either [email or web address] or www.postpartum.net 3. Referral(s): Integrated Art gallery manager (In Clinic) and [employer]:  new mom support  I discussed the assessment and treatment plan with the patient and/or parent/guardian. They were provided an opportunity to ask questions and all were answered. They agreed with the plan and demonstrated an understanding of the instructions.   They were advised to call back or seek an in-person evaluation if the symptoms worsen or if the condition fails to improve as anticipated.  Rae Lips, LCSW   Depression screen [facility] 2/9 06/22/2020 05/31/2020 05/18/2020 05/11/2020 04/05/2020  Decreased Interest 0 0 0 0 1  Down, Depressed, Hopeless 0 0 0 0 0  PHQ - 2 Score 0 0 0 0 1   Altered sleeping 1 0 1 1 2   Tired, decreased energy 1 0 0 1 2  Change in appetite 0 0 0  0 1  Feeling bad or failure about yourself  0 0 0 0 0  Trouble concentrating 0 0 0 0 0  Moving slowly or fidgety/restless 0 0 0 0 0  Suicidal thoughts 0 0 0 0 0  PHQ-9 Score 2 0 1 2 6    GAD 7 : Generalized Anxiety Score 06/22/2020 05/31/2020 05/18/2020 05/11/2020  Nervous, Anxious, on Edge 0 0 0 0  Control/stop worrying 0 0 0 0  Worry too much - different things 0 0 0 0  Trouble relaxing 0 0 0 0  Restless 0 0 0 0  Easily annoyed or irritable 0 0 0 1  Afraid - awful might happen 0 0 0 0  Total GAD 7 Score 0 0 0 1

## 2020-06-09 DIAGNOSIS — Z419 Encounter for procedure for purposes other than remedying health state, unspecified: Secondary | ICD-10-CM | POA: Diagnosis not present

## 2020-06-13 ENCOUNTER — Ambulatory Visit: Payer: Medicaid Other

## 2020-06-15 ENCOUNTER — Ambulatory Visit (INDEPENDENT_AMBULATORY_CARE_PROVIDER_SITE_OTHER): Payer: Medicaid Other

## 2020-06-15 ENCOUNTER — Other Ambulatory Visit: Payer: Self-pay

## 2020-06-15 VITALS — BP 121/89 | HR 87 | Wt 160.9 lb

## 2020-06-15 DIAGNOSIS — Z0130 Encounter for examination of blood pressure without abnormal findings: Secondary | ICD-10-CM

## 2020-06-15 DIAGNOSIS — Z5189 Encounter for other specified aftercare: Secondary | ICD-10-CM

## 2020-06-15 NOTE — Progress Notes (Signed)
Pt seen and examined Agree with A & P

## 2020-06-15 NOTE — Progress Notes (Signed)
Incision / Blood Pressure check  Patient here for blood pressure check following primary C-section on 06/01/20. Patient diagnosed with gestational hypertension, no BP meds during pregnancy. Prescribed 5 mg Norvasc prior to discharge; pt reports she was not given instructions to begin taking. BP today is 121/89, HR 87. C-section incision is open to air. Incision has multiple open areas with yellow, thin discharge. Sutures visible.  Reviewed with Alysia Penna, MD who comes to bedside to evaluate incision. Incision drained and packed with one piece of packing strip by MD. Pt to return to MAU on Saturday, 06/17/20, between 9AM and 3PM to have packing removed. Further plan of care to be established on Saturday.   Fleet Contras RN 06/15/20

## 2020-06-17 ENCOUNTER — Encounter (HOSPITAL_COMMUNITY): Payer: Self-pay | Admitting: Family Medicine

## 2020-06-17 ENCOUNTER — Inpatient Hospital Stay (HOSPITAL_COMMUNITY)
Admission: AD | Admit: 2020-06-17 | Discharge: 2020-06-17 | Discharge status: Against Medical Advice | Payer: Medicaid Other | Attending: Family Medicine | Admitting: Family Medicine

## 2020-06-17 ENCOUNTER — Other Ambulatory Visit: Payer: Self-pay

## 2020-06-17 DIAGNOSIS — O99335 Smoking (tobacco) complicating the puerperium: Secondary | ICD-10-CM | POA: Diagnosis not present

## 2020-06-17 DIAGNOSIS — G8918 Other acute postprocedural pain: Secondary | ICD-10-CM | POA: Insufficient documentation

## 2020-06-17 DIAGNOSIS — Z5321 Procedure and treatment not carried out due to patient leaving prior to being seen by health care provider: Secondary | ICD-10-CM | POA: Diagnosis not present

## 2020-06-17 DIAGNOSIS — F17210 Nicotine dependence, cigarettes, uncomplicated: Secondary | ICD-10-CM | POA: Insufficient documentation

## 2020-06-17 DIAGNOSIS — Z4889 Encounter for other specified surgical aftercare: Secondary | ICD-10-CM | POA: Insufficient documentation

## 2020-06-17 DIAGNOSIS — Z9889 Other specified postprocedural states: Secondary | ICD-10-CM | POA: Insufficient documentation

## 2020-06-17 HISTORY — DX: Anemia, unspecified: D64.9

## 2020-06-17 HISTORY — DX: Gestational (pregnancy-induced) hypertension without significant proteinuria, unspecified trimester: O13.9

## 2020-06-17 NOTE — MAU Provider Note (Signed)
History     CSN: [id number]  Arrival date and time: 06/17/20 1421   Event Date/Time   First Provider Initiated Contact with Patient 06/17/20 1510      Chief Complaint  Patient presents with  . Incisional Pain  . Drainage from Incision   Ms. Ashley Michael is a 35 y.o. G1P1001 at ppartum who presents to MAU for incision check. Patient was seen in office 06/15/2020 and per note had incision drained and packed and was told to come to MAU between 9AM and 3PM today for unpacking of wound and reevaluation. Per pt, she believes she had an appointment scheduled in MAU today per instructions of office staff. Patient reports she was not given antibiotics at office visit. Patient reports since the packing was placed the wound has developed an odor and is causing her increasing pain and also has copious yellow-clear fluid coming out. Patient describes the fluid as yellow and states that she had so much fluid come out at one point that it soaked her underwear and looked like she urinated on herself. Patient unable to tell provider exactly the schedule she is using for her pain medication management, but reports alternating between Tylenol and ibuprofen that she was given on discharge. Patient's partner asking multiple times if she can be prescribed oxycodone again for her pain, as she was only given 15 tablets on discharge.   OB History    Gravida  1   Para  1   Term  1   Preterm  0   AB  0   Living  1     SAB  0   IAB  0   Ectopic  0   Multiple  0   Live Births  1           Past Medical History:  Diagnosis Date  . Anemia   . Medical history non-contributory   . Pregnancy induced hypertension     Past Surgical History:  Procedure Laterality Date  . CESAREAN SECTION N/A 06/01/2020   Procedure: CESAREAN SECTION;  Surgeon: Myna Hidalgo, DO;  Location: [facility] LD ORS;  Service: Obstetrics;  Laterality: N/A;  . NO PAST SURGERIES      Family History  Problem Relation  Age of Onset  . Bipolar disorder Mother     Social History   Tobacco Use  . Smoking status: Current Every Day Smoker    Packs/day: 0.50    Types: Cigarettes  . Smokeless tobacco: Never Used  Vaping Use  . Vaping Use: Never used  Substance Use Topics  . Alcohol use: Not Currently  . Drug use: Not Currently    Types: Marijuana    Comment: First 3-4 months of pregnancy, has stopped since then    Allergies: No Known Allergies  No medications prior to admission.    Review of Systems  Constitutional: Negative for chills, diaphoresis, fatigue and fever.  Eyes: Negative for visual disturbance.  Respiratory: Negative for shortness of breath.   Cardiovascular: Negative for chest pain.  Gastrointestinal: Positive for abdominal pain. Negative for constipation, diarrhea, nausea and vomiting.       Wound discharge  Genitourinary: Negative for dysuria, flank pain, frequency, pelvic pain, urgency, vaginal bleeding and vaginal discharge.  Neurological: Negative for dizziness, weakness, light-headedness and headaches.   Physical Exam   Blood pressure 124/72, pulse 80, temperature 98.7 F (37.1 C), temperature source Oral, resp. rate 17, SpO2 100 %, unknown if currently breastfeeding.  Patient Vitals for the past  24 hrs:  BP Temp Temp src Pulse Resp SpO2  06/17/20 1614 124/72 -- -- 80 -- --  06/17/20 1500 131/82 98.7 F (37.1 C) Oral 81 17 100 %  06/17/20 1439 139/87 98.6 F (37 C) Oral 83 18 100 %   Physical Exam Vitals and nursing note reviewed.  Constitutional:      General: She is not in acute distress.    Appearance: Normal appearance. She is not ill-appearing, toxic-appearing or diaphoretic.  HENT:     Head: Normocephalic and atraumatic.  Pulmonary:     Effort: Pulmonary effort is normal.  Abdominal:     Comments: Wound dehisced with small strings of tissue holding incision together. Packing visible along length of incision, appears saturated with yellow-tinged fluid.  Slight odor coming from wound, which is located below pannus. Areas of induration located superior to the incision over the right half of incision. Patient does not appear to have tenderness on palpation except mildly over far left aspect of incision.  Skin:    General: Skin is warm and dry.  Neurological:     Mental Status: She is alert and oriented to person, place, and time.  Psychiatric:        Mood and Affect: Mood normal.        Behavior: Behavior normal.        Thought Content: Thought content normal.        Judgment: Judgment normal.    No results found for this or any previous visit (from the past 24 hour(s)).  MAU Course  Procedures  MDM -Dr. Barb Merino called for consult, but was in OR assisting Dr. Shawnie Pons with C/S and then immediately after finishing with C/S was planning to come to MAU, but was pulled into a delivery -Dr. Shawnie Pons called who recommends CBC and imaging with CT with contrast to evaluate -pt dissatisfied with wait time in MAU as she thought she had an appointment scheduled per conversation with [facility] earlier this week. Patient advised this is an emergency department and we do not have scheduled appointments. Patient wishing to leave MAU for food and a cigarette. Patient advised of plan for imaging and advised that incision is likely infected and that infection could spread and get worse. Patient advised not to leave MAU at this time, but to stay for additional work-up and possible prescription of antibiotics and change of wound dressing. Patient given time to discuss options with partner and decides to leave MAU AMA and reports she may return later. -message sent to [facility] to schedule pt for office appt -pt left AMA  Assessment and Plan   1. Encounter for post surgical wound check    -pt left AMA  Odie Sera [name] 06/17/2020, 6:57 PM

## 2020-06-17 NOTE — MAU Note (Signed)
Pt reports to mau with c/o increased drainage and pain at incision site.  Pt states she was seen in office on 4/7 and was told there was no infection but wound was packed in office.  Pt states she was told to come to mau today to have it reassessed.

## 2020-06-18 ENCOUNTER — Inpatient Hospital Stay (HOSPITAL_COMMUNITY)
Admission: AD | Admit: 2020-06-18 | Discharge: 2020-06-18 | Disposition: A | Discharge status: Home / Self Care | Payer: Medicaid Other | Attending: Obstetrics and Gynecology | Admitting: Obstetrics and Gynecology

## 2020-06-18 ENCOUNTER — Inpatient Hospital Stay (HOSPITAL_COMMUNITY): Payer: Medicaid Other

## 2020-06-18 ENCOUNTER — Encounter (HOSPITAL_COMMUNITY): Payer: Self-pay | Admitting: Obstetrics and Gynecology

## 2020-06-18 DIAGNOSIS — O135 Gestational [pregnancy-induced] hypertension without significant proteinuria, complicating the puerperium: Secondary | ICD-10-CM | POA: Insufficient documentation

## 2020-06-18 DIAGNOSIS — O900 Disruption of cesarean delivery wound: Secondary | ICD-10-CM | POA: Insufficient documentation

## 2020-06-18 DIAGNOSIS — O8601 Infection of obstetric surgical wound, superficial incisional site: Secondary | ICD-10-CM | POA: Insufficient documentation

## 2020-06-18 DIAGNOSIS — R188 Other ascites: Secondary | ICD-10-CM | POA: Diagnosis not present

## 2020-06-18 DIAGNOSIS — F17210 Nicotine dependence, cigarettes, uncomplicated: Secondary | ICD-10-CM | POA: Diagnosis not present

## 2020-06-18 DIAGNOSIS — Z79899 Other long term (current) drug therapy: Secondary | ICD-10-CM | POA: Insufficient documentation

## 2020-06-18 DIAGNOSIS — N852 Hypertrophy of uterus: Secondary | ICD-10-CM | POA: Diagnosis not present

## 2020-06-18 LAB — CBC WITH DIFFERENTIAL/PLATELET
Abs Immature Granulocytes: 0.04 10*3/uL (ref 0.00–0.07)
Basophils Absolute: 0 10*3/uL (ref 0.0–0.1)
Basophils Relative: 0 %
Eosinophils Absolute: 0.2 10*3/uL (ref 0.0–0.5)
Eosinophils Relative: 2 %
HCT: 34.4 % — ABNORMAL LOW (ref 36.0–46.0)
Hemoglobin: 10.8 g/dL — ABNORMAL LOW (ref 12.0–15.0)
Immature Granulocytes: 1 %
Lymphocytes Relative: 35 %
Lymphs Abs: 2.7 10*3/uL (ref 0.7–4.0)
MCH: 26.5 pg (ref 26.0–34.0)
MCHC: 31.4 g/dL (ref 30.0–36.0)
MCV: 84.5 fL (ref 80.0–100.0)
Monocytes Absolute: 0.5 10*3/uL (ref 0.1–1.0)
Monocytes Relative: 7 %
Neutro Abs: 4.3 10*3/uL (ref 1.7–7.7)
Neutrophils Relative %: 55 %
Platelets: 366 10*3/uL (ref 150–400)
RBC: 4.07 MIL/uL (ref 3.87–5.11)
RDW: 13 % (ref 11.5–15.5)
WBC: 7.8 10*3/uL (ref 4.0–10.5)
nRBC: 0 % (ref 0.0–0.2)

## 2020-06-18 LAB — COMPREHENSIVE METABOLIC PANEL
ALT: 16 U/L (ref 0–44)
AST: 14 U/L — ABNORMAL LOW (ref 15–41)
Albumin: 2.8 g/dL — ABNORMAL LOW (ref 3.5–5.0)
Alkaline Phosphatase: 52 U/L (ref 38–126)
Anion gap: 5 (ref 5–15)
BUN: <5 mg/dL — ABNORMAL LOW (ref 6–20)
CO2: 33 mmol/L — ABNORMAL HIGH (ref 22–32)
Calcium: 8.7 mg/dL — ABNORMAL LOW (ref 8.9–10.3)
Chloride: 102 mmol/L (ref 98–111)
Creatinine, Ser: 0.73 mg/dL (ref 0.44–1.00)
GFR, Estimated: >60 mL/min (ref >60)
Glucose, Bld: 90 mg/dL (ref 70–99)
Potassium: 3.3 mmol/L — ABNORMAL LOW (ref 3.5–5.1)
Sodium: 140 mmol/L (ref 135–145)
Total Bilirubin: 0.4 mg/dL (ref 0.3–1.2)
Total Protein: 6.7 g/dL (ref 6.5–8.1)

## 2020-06-18 MED ORDER — NU GAUZE PACKING STRIPS MISC: 1.0000 "application " | Freq: Two times a day (BID) | 5 refills | Status: AC

## 2020-06-18 MED ORDER — IOHEXOL 9 MG/ML PO SOLN
ORAL | Status: AC
  Administered 2020-06-18: 500 mL
  Filled 2020-06-18: qty 1000

## 2020-06-18 MED ORDER — AMLODIPINE BESYLATE 5 MG PO TABS: 5.0000 mg | ORAL_TABLET | Freq: Every day | ORAL | 11 refills | Status: AC

## 2020-06-18 MED ORDER — OXYCODONE HCL 5 MG PO TABS: 5.0000 mg | ORAL_TABLET | ORAL | 0 refills | Status: AC | PRN

## 2020-06-18 MED ORDER — CEPHALEXIN 500 MG PO CAPS
500.0000 mg | ORAL_CAPSULE | Freq: Once | ORAL | Status: AC
  Administered 2020-06-18: 500 mg via ORAL
  Filled 2020-06-18: qty 1

## 2020-06-18 MED ORDER — AMLODIPINE BESYLATE 10 MG PO TABS
10.0000 mg | ORAL_TABLET | Freq: Every day | ORAL | Status: DC
  Administered 2020-06-18: 10 mg via ORAL
  Filled 2020-06-18 (×2): qty 1

## 2020-06-18 MED ORDER — AMLODIPINE BESYLATE 5 MG PO TABS: 5.0000 mg | ORAL_TABLET | Freq: Every day | ORAL | 11 refills | Status: DC

## 2020-06-18 MED ORDER — IOHEXOL 300 MG/ML  SOLN
100.0000 mL | Freq: Once | INTRAMUSCULAR | Status: AC | PRN
  Administered 2020-06-18: 100 mL via INTRAVENOUS

## 2020-06-18 MED ORDER — CEPHALEXIN 500 MG PO CAPS: 500.0000 mg | ORAL_CAPSULE | Freq: Three times a day (TID) | ORAL | 0 refills | Status: DC

## 2020-06-18 MED ORDER — SODIUM CHLORIDE 0.9 % IR SOLN: 20.0000 mL | Freq: Two times a day (BID) | 0 refills | Status: AC

## 2020-06-18 MED ORDER — CEPHALEXIN 500 MG PO CAPS: 500.0000 mg | ORAL_CAPSULE | Freq: Three times a day (TID) | ORAL | 0 refills | Status: AC

## 2020-06-18 NOTE — MAU Note (Signed)
Second bottle of contrast given. 

## 2020-06-18 NOTE — MAU Provider Note (Signed)
Chief Complaint:  Incisional Pain and Drainage from Incision   Event Date/Time   First Provider Initiated Contact with Patient 06/18/20 1242     HPI: Ashley Michael is a 35 y.o. G1P1001 at 17 days S/P C/S who presents to maternity admissions reporting purulent drainage, tenderness and separation of C/S incision. Has clear-blood-tinged drainage last week. Was seen at [facility] for a wound check on 06/15/20 and wound was packed. Pt instructed to return on 4/9 for packing to be changed but could not wait. Was also noticing new onset of foul odor, tenderness and redness of incision. Reports that th redness and tenderness have improved. Unsure if she is having fevers.   Was Dx'd w/ GHTN in pregnancy. Started at Norvasc PP, but states she wasn't instructed to take it after D/C.  Denies HA, vision changes or epigastric pain.  Past Medical History:  Diagnosis Date  . Anemia   . Medical history non-contributory   . Pregnancy induced hypertension    OB History  Gravida Para Term Preterm AB Living  0 0 1  SAB IAB Ectopic Multiple Live Births  0 0 0 0 1    # Outcome Date GA Lbr Len/2nd Weight Sex Delivery Anes PTL Lv  1 Term 06/01/20 [redacted]w[redacted]d  3019 g F CS-LTranv Spinal  LIV   Past Surgical History:  Procedure Laterality Date  . CESAREAN SECTION N/A 06/01/2020   Procedure: CESAREAN SECTION;  Surgeon: Myna Hidalgo, DO;  Location: [facility] LD ORS;  Service: Obstetrics;  Laterality: N/A;  . NO PAST SURGERIES     Family History  Problem Relation Age of Onset  . Bipolar disorder Mother    Social History   Tobacco Use  . Smoking status: Current Every Day Smoker    Packs/day: 0.50    Types: Cigarettes  . Smokeless tobacco: Never Used  Vaping Use  . Vaping Use: Never used  Substance Use Topics  . Alcohol use: Not Currently  . Drug use: Not Currently    Types: Marijuana    Comment: First 3-4 months of pregnancy, has stopped since then   No Known Allergies Medications  Prior to Admission  Medication Sig Dispense Refill Last Dose  . acetaminophen (TYLENOL) 325 MG tablet TAKE 2 TABLETS (650 MG TOTAL) BY MOUTH EVERY 4 (FOUR) HOURS AS NEEDED FOR MILD PAIN (TEMPERATURE > 101.5.). 30 tablet 0 06/18/2020 at 0900  . ferrous sulfate 325 (65 FE) MG tablet Take 1 tablet (325 mg total) by mouth every other day. 30 tablet 3 06/18/2020 at 0700  . ibuprofen (ADVIL) 600 MG tablet Take 1 tablet (600 mg total) by mouth every 6 (six) hours as needed. 30 tablet 0 06/18/2020 at 0700  . prenatal vitamin w/FE, FA (PRENATAL 1 + 1) 27-1 MG TABS tablet Take 1 tablet by mouth daily at 12 noon. 30 tablet 7 06/18/2020 at 0700  . senna-docusate (SENOKOT-S) 8.6-50 MG tablet Take 2 tablets by mouth daily. 30 tablet 0 Past Week at Unknown time  . acetaminophen (TYLENOL) 500 MG tablet Take 2 tablets (1,000 mg total) by mouth every 6 (six) hours as needed for mild pain (temperature > 101.5.). (Patient not taking: Reported on 06/15/2020)     . ascorbic acid (VITAMIN C) 250 MG tablet Take 1 tablet (250 mg total) by mouth every other day. (Patient not taking: Reported on 06/15/2020)     . calcium carbonate (TUMS - DOSED IN MG ELEMENTAL CALCIUM) 500 MG chewable tablet Chew 1 tablet  by mouth 2 (two) times daily as needed for indigestion or heartburn. (Patient not taking: Reported on 06/15/2020)     . ferrous sulfate 325 (65 FE) MG tablet TAKE 1 TABLET (325 MG TOTAL) BY MOUTH EVERY OTHER DAY. (Patient not taking: Reported on 06/15/2020) 30 tablet 3   . ibuprofen (ADVIL) 600 MG tablet TAKE 1 TABLET (600 MG TOTAL) BY MOUTH EVERY 6 (SIX) HOURS. (Patient not taking: Reported on 06/15/2020) 30 tablet 0   . oxyCODONE (OXY IR/ROXICODONE) 5 MG immediate release tablet TAKE 1-2 TABLETS (5-10 MG TOTAL) BY MOUTH EVERY 4 (FOUR) HOURS AS NEEDED FOR MODERATE PAIN. (Patient not taking: Reported on 06/15/2020) 15 tablet 0   . senna-docusate (SENOKOT-S) 8.6-50 MG tablet TAKE 2 TABLETS BY MOUTH DAILY. (Patient not taking: Reported on  06/15/2020) 30 tablet 0   . [DISCONTINUED] amLODipine (NORVASC) 5 MG tablet TAKE 1 TABLET (5 MG TOTAL) BY MOUTH DAILY. (Patient not taking: Reported on 06/15/2020) 30 tablet 0   . [DISCONTINUED] oxyCODONE (OXY IR/ROXICODONE) 5 MG immediate release tablet Take 1-2 tablets (5-10 mg total) by mouth every 4 (four) hours as needed for moderate pain. 21 tablet 0     I have reviewed patient's Past Medical Hx, Surgical Hx, Family Hx, Social Hx, medications and allergies.   ROS:  Review of Systems  Constitutional: Negative for chills and fever (Unsure).  Eyes: Negative for visual disturbance.  Gastrointestinal: Positive for abdominal pain (mild incisional tenderness).  Musculoskeletal: Negative for myalgias.  Skin: Positive for wound.  Neurological: Negative for headaches.    Physical Exam   Patient Vitals for the past 24 hrs:  BP Temp Temp src Pulse Resp SpO2  06/18/20 2113 140/90 98.6 F (37 C) Oral 82 14 100 %  06/18/20 1807 -- 98.8 F (37.1 C) Oral -- 16 98 %  06/18/20 1727 129/69 -- -- 63 -- --  06/18/20 1630 132/87 -- -- 61 -- --  06/18/20 1616 (!) 159/107 -- -- 74 -- --  06/18/20 1600 (!) 151/98 -- -- 72 -- --  06/18/20 1545 129/79 -- -- 68 -- --  06/18/20 1530 (!) 127/98 -- -- 78 -- --  06/18/20 1515 (!) 143/104 -- -- 67 -- --  06/18/20 1505 134/86 -- -- 66 -- --  06/18/20 1500 (!) 147/100 -- -- 76 -- --  06/18/20 1445 (!) 146/102 -- -- 78 -- --  06/18/20 1430 132/80 -- -- 65 -- --  06/18/20 1415 131/88 -- -- 61 -- --  06/18/20 1400 131/70 -- -- (!) 51 -- --  06/18/20 1345 130/74 -- -- (!) 54 -- 99 %  06/18/20 1340 -- -- -- -- -- 99 %  06/18/20 1335 -- -- -- -- -- 99 %  06/18/20 1330 118/70 -- -- 75 -- 100 %  06/18/20 1325 -- -- -- -- -- 99 %  06/18/20 1320 -- -- -- -- -- 99 %  06/18/20 1315 124/77 -- -- 63 -- 98 %  06/18/20 1305 -- -- -- -- -- 100 %  06/18/20 1300 (!) 150/97 -- -- 70 -- 100 %  06/18/20 1255 (!) 147/87 98.4 F (36.9 C) Oral 65 17 100 %  06/18/20 1048 (!)  153/91 98.7 F (37.1 C) Oral 93 18 100 %   Constitutional: Well-developed, well-nourished female in no acute distress.  Cardiovascular: normal rate Respiratory: normal effort GI: Abd soft, NT Skin: Mild incisional tenderness. No erythema or swelling. Dehiscence of 3 cm mid portion and 2 other smaller areas of C/S incision.  Packing removed. Small amount of foul-smelling purulent drainage. Defect probes to 4.5 cm on pt's right, 3.5 cm on left and 2 cm deep. Healed scars from Hidradenitis Suppurativa noted.  MS: Extremities nontender, no edema, normal ROM Neurologic: Alert and oriented x 4.  GU: Deferred   Labs: Results for orders placed or performed during the hospital encounter of 06/18/20 (from the past 24 hour(s))  CBC with Differential/Platelet     Status: Abnormal   Collection Time: 06/18/20 12:37 PM  Result Value Ref Range   WBC 7.8 4.0 - 10.5 K/uL   RBC 4.07 3.87 - 5.11 MIL/uL   Hemoglobin 10.8 (L) 12.0 - 15.0 g/dL   HCT 16.1 (L) 09.6 - 04.5 %   MCV 84.5 80.0 - 100.0 fL   MCH 26.5 26.0 - 34.0 pg   MCHC 31.4 30.0 - 36.0 g/dL   RDW 40.9 81.1 - 91.4 %   Platelets 366 150 - 400 K/uL   nRBC 0.0 0.0 - 0.2 %   Neutrophils Relative % 55 %   Neutro Abs 4.3 1.7 - 7.7 K/uL   Lymphocytes Relative 35 %   Lymphs Abs 2.7 0.7 - 4.0 K/uL   Monocytes Relative 7 %   Monocytes Absolute 0.5 0.1 - 1.0 K/uL   Eosinophils Relative 2 %   Eosinophils Absolute 0.2 0.0 - 0.5 K/uL   Basophils Relative 0 %   Basophils Absolute 0.0 0.0 - 0.1 K/uL   Immature Granulocytes 1 %   Abs Immature Granulocytes 0.04 0.00 - 0.07 K/uL  Comprehensive metabolic panel     Status: Abnormal   Collection Time: 06/18/20 12:37 PM  Result Value Ref Range   Sodium 140 135 - 145 mmol/L   Potassium 3.3 (L) 3.5 - 5.1 mmol/L   Chloride 102 98 - 111 mmol/L   CO2 33 (H) 22 - 32 mmol/L   Glucose, Bld 90 70 - 99 mg/dL   BUN <5 (L) 6 - 20 mg/dL   Creatinine, Ser 7.82 0.44 - 1.00 mg/dL   Calcium 8.7 (L) 8.9 - 10.3 mg/dL    Total Protein 6.7 6.5 - 8.1 g/dL   Albumin 2.8 (L) 3.5 - 5.0 g/dL   AST 14 (L) 15 - 41 U/L   ALT 16 0 - 44 U/L   Alkaline Phosphatase 52 38 - 126 U/L   Total Bilirubin 0.4 0.3 - 1.2 mg/dL   GFR, Estimated >95 >62 mL/min   Anion gap 5 5 - 15    Imaging:  CT PELVIS W CONTRAST  Result Date: 06/18/2020 CLINICAL DATA:  Post cesarean section on 06/01/2020. Pain and drainage at the incision site. Soft tissue infection suspected. EXAM: CT PELVIS WITH CONTRAST TECHNIQUE: Multidetector CT imaging of the pelvis was performed using the standard protocol following the bolus administration of intravenous contrast. CONTRAST:  OMNIPAQUE IOHEXOL 300 MG/ML  SOLN COMPARISON:  None. FINDINGS: Urinary Tract:  No abnormality visualized. Bowel:  Unremarkable visualized pelvic bowel loops. Vascular/Lymphatic: No pathologically enlarged lymph nodes. No significant vascular abnormality seen. Reproductive: The uterus is enlarged. Horizontal linear hypoattenuated tract measuring approximately 6 mm in thickness is seen in the anterior lower uterine segment, likely corresponding to the incision site. There is a mild convexity of the anterior uterine wall overlying the incision site. No fluid collections outside of the uterus are seen. Other: There is an 12 by 1.5 cm thick-walled horizontally oriented gas and fluid collection in the lower anterior abdominal wall, corresponding to the superficial incision site. The collection extends  to the superficial muscular fascia. Musculoskeletal: No acute osseous abnormalities. IMPRESSION: 1. 12 cm thick-walled horizontally oriented gas and fluid collection in the lower anterior abdominal wall, corresponding to the superficial incision site. The collection extends to the superficial muscular fascia, and likely represents an abscess. 2. Horizontal linear hypoattenuated tract measuring approximately 6 mm in thickness in the anterior lower uterine segment, likely corresponding to the incision  site. It is difficult to determine by imaging whether this represents normal scarring from recent surgical intervention, or an infected surgical site. Clinical correlation is recommended. If further imaging is deemed necessary, MRI of the pelvis with contrast may be considered. 3. No intra-abdominal fluid collections outside of the uterus are seen. Electronically Signed   By: Ted Mcalpine M.D.   On: 06/18/2020 17:40    MAU Course: Orders Placed This Encounter  Procedures  . Gauze packing strips 1/2"  . CT PELVIS W CONTRAST  . CBC with Differential/Platelet  . Comprehensive metabolic panel  . Pack wound  . Discharge patient   Meds ordered this encounter  Medications  . iohexol (OMNIPAQUE) 9 MG/ML oral solution    Leo Grosser, Monica   : cabinet override  . amLODipine (NORVASC) tablet 10 mg  . iohexol (OMNIPAQUE) 300 MG/ML solution 100 mL  . cephALEXin (KEFLEX) capsule 500 mg  . DISCONTD: amLODipine (NORVASC) 5 MG tablet    Sig: Take 1 tablet (5 mg total) by mouth daily.    Dispense:  30 tablet    Refill:  11    Order Specific Question:   Supervising Provider    Answer:   CONSTANT, PEGGY [[id number]]  . DISCONTD: cephALEXin (KEFLEX) 500 MG capsule    Sig: Take 1 capsule (500 mg total) by mouth 3 (three) times daily for 10 days.    Dispense:  30 capsule    Refill:  0    Order Specific Question:   Supervising Provider    Answer:   CONSTANT, PEGGY [[id number]]  . oxyCODONE (OXY IR/ROXICODONE) 5 MG immediate release tablet    Sig: Take 1 tablet (5 mg total) by mouth every 4 (four) hours as needed for severe pain.    Dispense:  20 tablet    Refill:  0    Order Specific Question:   Supervising Provider    Answer:   CONSTANT, PEGGY [[id number]]  . cephALEXin (KEFLEX) 500 MG capsule    Sig: Take 1 capsule (500 mg total) by mouth 3 (three) times daily for 10 days.    Dispense:  30 capsule    Refill:  0    Order Specific Question:   Supervising Provider    Answer:   CONSTANT, PEGGY [[id number]]  .  amLODipine (NORVASC) 5 MG tablet    Sig: Take 1 tablet (5 mg total) by mouth daily.    Dispense:  30 tablet    Refill:  11    Order Specific Question:   Supervising Provider    Answer:   CONSTANT, PEGGY [[id number]]  . sodium chloride irrigation (CURITY STERILE SALINE) 0.9 % irrigation    Sig: Irrigate with 20 mLs as directed in the morning and at bedtime for 25 doses.    Dispense:  500 mL    Refill:  0    Order Specific Question:   Supervising Provider    Answer:   CONSTANT, PEGGY [[id number]]  . Gauze Pads & Dressings (NU GAUZE PACKING STRIPS) MISC    Sig: 1 application by Other route in the morning and at bedtime for  20 days. Pack wound twice a day until incision closes.    Dispense:  1 each    Refill:  5    Order Specific Question:   Supervising Provider    Answer:   CONSTANT, PEGGY [[id number]]    MDM: - Wound dehiscence and infection. No evidence of intrabdominal abscess on CT. Pt afebrile and w/out leukocytosis. Appropriate for OP management per consult w/ Dr. Jolayne Pantheronstant. Plan wet-to-dry packing of wound BID. Pt's partner was taught and performed packing in MAU. Verbalized understanding. Handout given. In-basket massage to schedule in-person appt in office this week. Rx Keflex 500 TID x 10 days. Oxy for pain.   - BP's remain elevated. Pt did not get Norvasc Rx. New Rx sent and pt instructed to take. No evidence of Pre-E w/ severe features. P:C ratio not repeated since it would not affect management. Pre-E precautions.    Assessment: 1. Infection of obstetric surgical wound, superficial incisional site   2. Gestational hypertension without significant proteinuria, postpartum   3. Wound dehiscence, cesarean, postpartum condition     Plan: Discharge home in stable condition per consult w/ Dr. Jolayne Pantheronstant.  SSI and Pre-E precautions reviewed.   Follow-up Information    [facility] up on 06/22/2020.   Specialty: Obstetrics and Gynecology Why:  Will change appointment to in-person visit for incision check.  Contact information: [address] [phone]       [facility] Follow up.   Specialty: Obstetrics and Gynecology Why: as needed in pregnancy-related emergencies (fever greater that 100.4, severe incision pain, redness and swelling at incision, severe headaches, vision changes, severe upper abdominal pain) Contact information: [address] [id number] [place] [phone]              Allergies as of 06/18/2020   No Known Allergies     Medication List    STOP taking these medications   ascorbic acid 250 MG tablet Commonly known as: VITAMIN C   calcium carbonate 500 MG chewable tablet Commonly known as: TUMS - dosed in mg elemental calcium     TAKE these medications   acetaminophen 325 MG tablet Commonly known as: TYLENOL TAKE 2 TABLETS (650 MG TOTAL) BY MOUTH EVERY 4 (FOUR) HOURS AS NEEDED FOR MILD PAIN (TEMPERATURE > 101.5.). What changed: Another medication with the same name was removed. Continue taking this medication, and follow the directions you see here.   amLODipine 5 MG tablet Commonly known as: NORVASC Take 1 tablet (5 mg total) by mouth daily.   cephALEXin 500 MG capsule Commonly known as: KEFLEX Take 1 capsule (500 mg total) by mouth 3 (three) times daily for 10 days.   ferrous sulfate 325 (65 FE) MG tablet Take 1 tablet (325 mg total) by mouth every other day. What changed: Another medication with the same name was removed. Continue taking this medication, and follow the directions you see here.   ibuprofen 600 MG tablet Commonly known as: ADVIL Take 1 tablet (600 mg total) by mouth every 6 (six) hours as needed. What changed: Another medication with the same name was removed. Continue taking this medication, and follow the directions you see here.   Nu Gauze Packing Strips Misc 1  application by Other route in the morning and at bedtime for 20 days. Pack wound twice a day until incision closes.   oxyCODONE 5 MG immediate release tablet Commonly known  as: Oxy IR/ROXICODONE Take 1 tablet (5 mg total) by mouth every 4 (four) hours as needed for severe pain. What changed:   how much to take  reasons to take this  Another medication with the same name was removed. Continue taking this medication, and follow the directions you see here.   prenatal vitamin w/FE, FA 27-1 MG Tabs tablet Take 1 tablet by mouth daily at 12 noon.   senna-docusate 8.6-50 MG tablet Commonly known as: Senokot-S Take 2 tablets by mouth daily. What changed: Another medication with the same name was removed. Continue taking this medication, and follow the directions you see here.   sodium chloride irrigation 0.9 % irrigation Commonly known as: Curity Sterile Saline Irrigate with 20 mLs as directed in the morning and at bedtime for 25 doses.       Katrinka Blazing, [place] 06/18/2020 8:55 PM

## 2020-06-18 NOTE — MAU Note (Signed)
Pt reports to mau with c/o excess drainage from surgical incision that now has a foul odor.  Pt also reports increased pain at site.

## 2020-06-18 NOTE — Discharge Instructions (Signed)
Pack the incision twice a day.   Wound Packing Wound packing usually involves placing a moistened packing material into your wound and then covering it with an outer bandage (dressing). This helps promote proper healing of deep tissue and tissue under the skin. It also helps prevent bleeding, infection, and further injury. Wounds are packed until deep tissues heal. The time it takes for this to occur is different for everyone. Your health care provider will show you how to pack and dress your wound. Using gloves and a clean technique is important in order to avoid spreading germs into your wound. Supplies needed:  Soap and water.  Disposable gloves.  Wetting solution.  Clean bowl.  Clean packing material (gauze or gauze sponges).  Clean paper towels.  Outer dressing.  Tape.  Cotton-tipped swabs.  Small plastic bag. How to pack your wound Follow your health care provider's instructions on how often you need to change dressings and pack your wound. You will likely be asked to change dressings 1-2 times a day. Preparing the new packing material 1. Clean and disinfect your work surface or countertop. 2. Set a plastic bag on or near your work surface. 3. Wash your hands well with soap and water. 4. Put a clean paper towel on the counter. 5. Put a clean bowl on the towel. Be sure to only touch the outside of the bowl when handling it. 6. Pour wetting solution into the bowl. 7. Cut your packing material (gauze or sponges) to the right size for your wound. Drop it into the bowl. 8. Cut 4 tape strips that you will use to seal the outer dressing. 9. Put cotton-tipped swabs on the clean paper towel.   Removing the old packing material and dressing 1. Put on a set of gloves. 2. Gently remove the old dressing and packing material. 3. Remove your gloves. 4. Put the removed items, including the gloves, into the plastic bag to throw away later. 5. Wash your hands well with soap and water  again. Applying the new packing material and dressing 1. Put on a new set of gloves. 2. Squeeze the packing material in the bowl to release the extra liquid. The packing material should be moist, but not dripping wet. 3. Gently place the packing material into the wound. Use a cotton-tipped swab to guide it into place, filling all of the space. 4. Dry your gloved fingertips on the paper towel. 5. Open up your outer dressing supplies and put them on a dry part of the paper towel. Keep them from getting wet. 6. Place the outer dressing over the packed wound. 7. Tape the 4 outer edges of the outer dressing in place. 8. Remove your gloves. 9. Wash your hands again with soap and water. 10. Clean and disinfect your work surface or countertop. General tips  Follow your health care provider's instructions on how much to pack the wound. At first, you may need to pack it more tightly to help stop bleeding. As the wound begins to heal inside, you will use less packing material and pack the wound loosely to allow tissue to heal slowly from the inside out.  Keep the dressing clean and dry.  Follow any other instructions given by your health care provider on how to aid healing. This may include applying warm or cold compresses, raising (elevating) the affected area, or wearing a compression dressing.  Check your wound site every day for signs of infection. Check for: ? More redness, swelling, or  pain. ? More fluid or blood. ? Warmth. ? Pus or a bad smell.  Ask your health care provider about avoiding sun exposure and using sunscreen when the dressings are no longer needed.  Keep all follow-up visits as told by your health care provider. This is important. Contact a health care provider if:  You have more drainage, redness, swelling, or pain at your wound site.  You notice a bad smell coming from the wound site.  Your wound site feels warm to the touch.  Your wound becomes larger or  deeper.  Your wound changes in size or depth. Get help right away if:  Your pain is not controlled with pain medicine.  The tissue inside your wound changes color from pink to white, yellow, or black.  You have a fever.  You have shaking chills.  You are having trouble packing your wound. Summary  Wound packing usually involves placing a moistened packing material into your wound and then covering it with an outer bandage (dressing).  Follow your health care provider's instructions on how often you need to change dressings and pack your wound. You will likely be asked to change dressings 1-2 times a day.  When packing your wound, it is important to use gloves and a clean technique in order to avoid spreading germs into the wound.  Check your wound site every day for signs of infection. This information is not intended to replace advice given to you by your health care provider. Make sure you discuss any questions you have with your health care provider. Document Revised: 04/03/2017 Document Reviewed: 04/03/2017 Elsevier Patient Education  2021 [employer].

## 2020-06-20 ENCOUNTER — Telehealth: Payer: Self-pay | Admitting: Family Medicine

## 2020-06-20 NOTE — Telephone Encounter (Signed)
Called pt to schedule wound check per Nugent, but unable to reach pt per aurto line

## 2020-06-22 ENCOUNTER — Ambulatory Visit: Payer: Medicaid Other | Admitting: Obstetrics and Gynecology

## 2020-06-22 ENCOUNTER — Ambulatory Visit (INDEPENDENT_AMBULATORY_CARE_PROVIDER_SITE_OTHER): Payer: Medicaid Other | Admitting: Clinical

## 2020-06-22 DIAGNOSIS — Z9189 Other specified personal risk factors, not elsewhere classified: Secondary | ICD-10-CM

## 2020-06-22 NOTE — Patient Instructions (Signed)
[facility] for [facility] [address] [place], [place] [address] [phone] (main office) [phone] (Alishah Schulte's office)  New mom support groups:  [email or web address]  [email or web address].net

## 2020-07-06 NOTE — BH Specialist Note (Signed)
Integrated Behavioral Health via Telemedicine Visit  07/06/2020 Ashley Michael [id number]  Number of Integrated Behavioral Health visits: 2 Session Start time: 1:18  Session End time: 1:38 Total time: 20  Referring Provider: Myna Hidalgo, DO Patient/Family location: Home [facility] Provider location: Center for [facility]  All persons participating in visit: Patient Ashley Michael and [facility] Brielle Moro   Types of Service: Individual psychotherapy and Telephone visit  I connected with Ashley Michael and/or Romero Belling Nimmons's n/a via  Telephone or Video Enabled Telemedicine Application  (Video is Caregility application) and verified that I am speaking with the correct person using two identifiers. Discussed confidentiality: Yes   I discussed the limitations of telemedicine and the availability of in person appointments.  Discussed there is a possibility of technology failure and discussed alternative modes of communication if that failure occurs.  I discussed that engaging in this telemedicine visit, they consent to the provision of behavioral healthcare and the services will be billed under their insurance.  Patient and/or legal guardian expressed understanding and consented to Telemedicine visit: Yes   Presenting Concerns: Patient and/or family reports the following symptoms/concerns: Pt states her primary concern today is missing postpartum medical visit this morning and feeling anxious about formula shortage; feels she is coping better than expected postpartum, getting outside daily, sleeping when baby sleeps, keeping a routine Duration of problem: Postpartum; Severity of problem: mild  Patient and/or Family's Strengths/Protective Factors: Social connections, Concrete supports in place (healthy food, safe environments, etc.), Sense of purpose and Physical Health (exercise, healthy diet, medication compliance,  etc.)  Goals Addressed: Patient will: 1.  Maintain reduction of symptoms of: anxiety and depression  2.  Demonstrate ability to: Increase healthy adjustment to current life circumstances  Progress towards Goals: Achieved  Interventions: Interventions utilized:  Supportive Counseling Standardized Assessments completed: Not Needed  Patient and/or Family Response: Pt agrees to treatment plan  Assessment: Patient currently experiencing At risk of depression.   Patient may benefit from supportive counseling today.  Plan: 1. Follow up with behavioral health clinician on : As needed 2. Behavioral recommendations:  -Continue taking prenatal vitamin and iron pill, as prescribed, until postpartum medical visit (rescheduled) -Continue prioritizing self-care (sleeping, eating, warm baths as needed; accepting help from friends and family; time outdoors) -Continue to consider joining new mom support group online at [email or web address] or [email or web address]  3. Referral(s): Integrated [employer] (In Clinic)  I discussed the assessment and treatment plan with the patient and/or parent/guardian. They were provided an opportunity to ask questions and all were answered. They agreed with the plan and demonstrated an understanding of the instructions.   They were advised to call back or seek an in-person evaluation if the symptoms worsen or if the condition fails to improve as anticipated.  Rae Lips, LCSW   Depression screen [facility] 2/9 06/22/2020 05/31/2020 05/18/2020 05/11/2020 04/05/2020  Decreased Interest 0 0 0 0 1  Down, Depressed, Hopeless 0 0 0 0 0  PHQ - 2 Score 0 0 0 0 1  Altered sleeping 1 0 1 1 2   Tired, decreased energy 1 0 0 1 2  Change in appetite 0 0 0 0 1  Feeling bad or failure about yourself  0 0 0 0 0  Trouble concentrating 0 0 0 0 0  Moving slowly or fidgety/restless 0 0 0 0 0  Suicidal thoughts 0 0 0 0 0  PHQ-9 Score 2 0 1 2 6  GAD 7 : Generalized  Anxiety Score 06/22/2020 05/31/2020 05/18/2020 05/11/2020  Nervous, Anxious, on Edge 0 0 0 0  Control/stop worrying 0 0 0 0  Worry too much - different things 0 0 0 0  Trouble relaxing 0 0 0 0  Restless 0 0 0 0  Easily annoyed or irritable 0 0 0 1  Afraid - awful might happen 0 0 0 0  Total GAD 7 Score 0 0 0 1

## 2020-07-09 DIAGNOSIS — Z419 Encounter for procedure for purposes other than remedying health state, unspecified: Secondary | ICD-10-CM | POA: Diagnosis not present

## 2020-07-18 ENCOUNTER — Ambulatory Visit: Payer: Medicaid Other | Admitting: Medical

## 2020-07-20 ENCOUNTER — Ambulatory Visit: Payer: Medicaid Other | Admitting: Obstetrics and Gynecology

## 2020-07-20 ENCOUNTER — Ambulatory Visit (INDEPENDENT_AMBULATORY_CARE_PROVIDER_SITE_OTHER): Payer: Medicaid Other | Admitting: Clinical

## 2020-07-20 DIAGNOSIS — Z9189 Other specified personal risk factors, not elsewhere classified: Secondary | ICD-10-CM | POA: Diagnosis not present

## 2020-08-08 ENCOUNTER — Ambulatory Visit: Payer: Medicaid Other | Admitting: Certified Nurse Midwife

## 2020-08-09 DIAGNOSIS — Z419 Encounter for procedure for purposes other than remedying health state, unspecified: Secondary | ICD-10-CM | POA: Diagnosis not present

## 2020-08-11 ENCOUNTER — Ambulatory Visit: Payer: Medicaid Other | Admitting: Certified Nurse Midwife

## 2020-09-08 DIAGNOSIS — Z419 Encounter for procedure for purposes other than remedying health state, unspecified: Secondary | ICD-10-CM | POA: Diagnosis not present

## 2020-10-09 DIAGNOSIS — Z419 Encounter for procedure for purposes other than remedying health state, unspecified: Secondary | ICD-10-CM | POA: Diagnosis not present

## 2020-11-09 DIAGNOSIS — Z419 Encounter for procedure for purposes other than remedying health state, unspecified: Secondary | ICD-10-CM | POA: Diagnosis not present

## 2020-12-09 DIAGNOSIS — Z419 Encounter for procedure for purposes other than remedying health state, unspecified: Secondary | ICD-10-CM | POA: Diagnosis not present

## 2021-01-09 DIAGNOSIS — Z419 Encounter for procedure for purposes other than remedying health state, unspecified: Secondary | ICD-10-CM | POA: Diagnosis not present

## 2021-02-08 DIAGNOSIS — Z419 Encounter for procedure for purposes other than remedying health state, unspecified: Secondary | ICD-10-CM | POA: Diagnosis not present

## 2021-03-11 DIAGNOSIS — Z419 Encounter for procedure for purposes other than remedying health state, unspecified: Secondary | ICD-10-CM | POA: Diagnosis not present

## 2021-04-11 DIAGNOSIS — Z419 Encounter for procedure for purposes other than remedying health state, unspecified: Secondary | ICD-10-CM | POA: Diagnosis not present

## 2021-05-09 DIAGNOSIS — Z419 Encounter for procedure for purposes other than remedying health state, unspecified: Secondary | ICD-10-CM | POA: Diagnosis not present

## 2021-06-09 DIAGNOSIS — Z419 Encounter for procedure for purposes other than remedying health state, unspecified: Secondary | ICD-10-CM | POA: Diagnosis not present

## 2021-07-09 DIAGNOSIS — Z419 Encounter for procedure for purposes other than remedying health state, unspecified: Secondary | ICD-10-CM | POA: Diagnosis not present

## 2021-08-09 DIAGNOSIS — Z419 Encounter for procedure for purposes other than remedying health state, unspecified: Secondary | ICD-10-CM | POA: Diagnosis not present

## 2021-09-08 DIAGNOSIS — Z419 Encounter for procedure for purposes other than remedying health state, unspecified: Secondary | ICD-10-CM | POA: Diagnosis not present

## 2021-10-09 DIAGNOSIS — Z419 Encounter for procedure for purposes other than remedying health state, unspecified: Secondary | ICD-10-CM | POA: Diagnosis not present

## 2021-11-09 DIAGNOSIS — Z419 Encounter for procedure for purposes other than remedying health state, unspecified: Secondary | ICD-10-CM | POA: Diagnosis not present

## 2021-12-09 DIAGNOSIS — Z419 Encounter for procedure for purposes other than remedying health state, unspecified: Secondary | ICD-10-CM | POA: Diagnosis not present

## 2022-01-09 DIAGNOSIS — Z419 Encounter for procedure for purposes other than remedying health state, unspecified: Secondary | ICD-10-CM | POA: Diagnosis not present

## 2022-02-08 DIAGNOSIS — Z419 Encounter for procedure for purposes other than remedying health state, unspecified: Secondary | ICD-10-CM | POA: Diagnosis not present

## 2022-03-11 DIAGNOSIS — Z419 Encounter for procedure for purposes other than remedying health state, unspecified: Secondary | ICD-10-CM | POA: Diagnosis not present

## 2022-04-11 DIAGNOSIS — Z419 Encounter for procedure for purposes other than remedying health state, unspecified: Secondary | ICD-10-CM | POA: Diagnosis not present

## 2022-05-10 DIAGNOSIS — Z419 Encounter for procedure for purposes other than remedying health state, unspecified: Secondary | ICD-10-CM | POA: Diagnosis not present

## 2022-06-10 DIAGNOSIS — Z419 Encounter for procedure for purposes other than remedying health state, unspecified: Secondary | ICD-10-CM | POA: Diagnosis not present

## 2022-07-10 DIAGNOSIS — Z419 Encounter for procedure for purposes other than remedying health state, unspecified: Secondary | ICD-10-CM | POA: Diagnosis not present

## 2022-08-08 IMAGING — CT CT PELVIS W/ CM
2 of 3 series · 15 of 46 positions shown, 17 images · IV contrast (APPLIED)
Comparison: None.

CLINICAL DATA: Post cesarean section on 06/01/2020. Pain and
drainage at the incision site.

Soft tissue infection suspected.
EXAM:
CT PELVIS WITH CONTRAST
TECHNIQUE: Multidetector CT imaging of the pelvis was performed using the
standard protocol following the bolus administration of intravenous
contrast.
CONTRAST:  100mL OMNIPAQUE IOHEXOL 300 MG/ML  SOLN

[Series 3: abd/ pelvis 5.0 i30f 2 · axial · 0.92mm/px · z∈[+676,+901]mm · 12 of 53 slices shown, 14 images]
[im 4/53  soft-tissue]
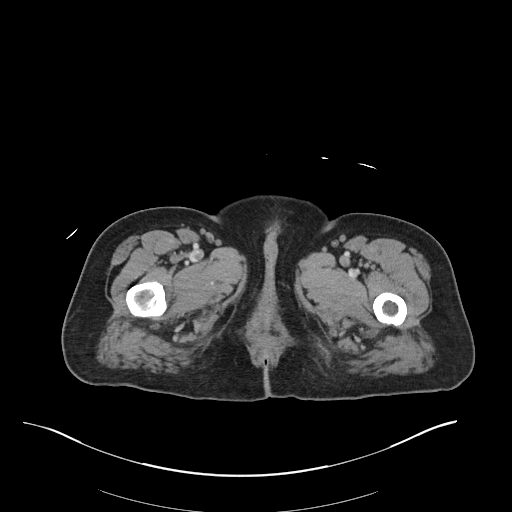
[im 4/53  bone]
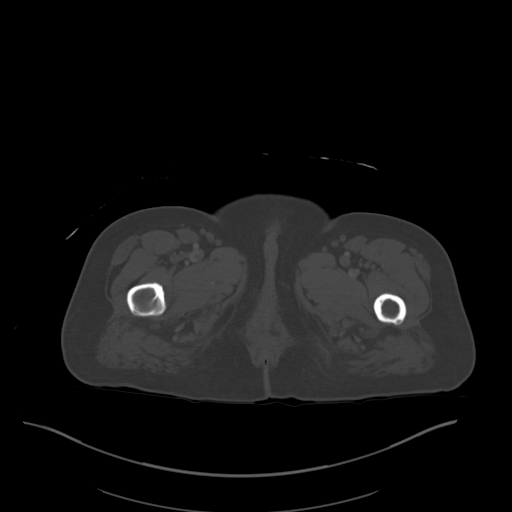
[im 7/53  soft-tissue]
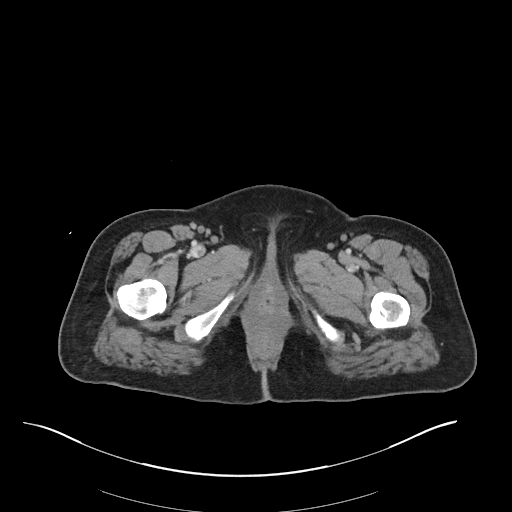
[im 12/53  soft-tissue]
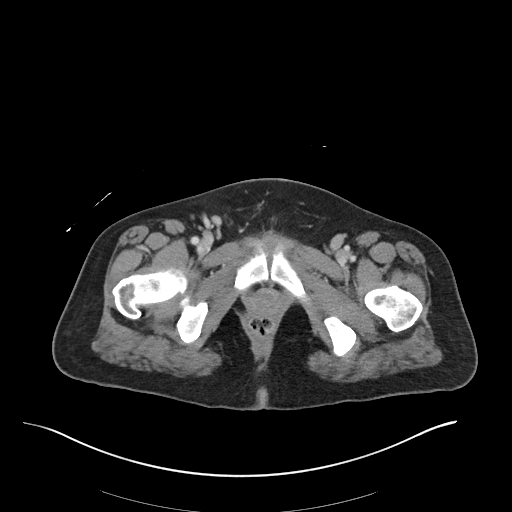
[im 16/53  soft-tissue]
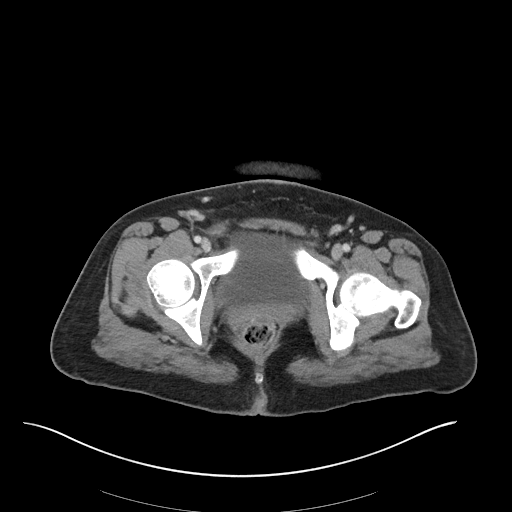
[im 21/53  soft-tissue]
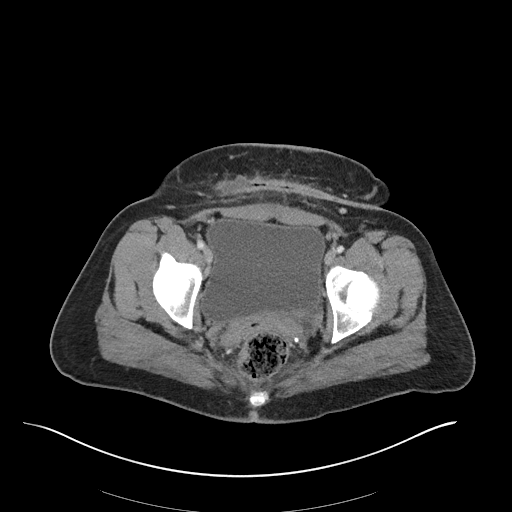
[im 24/53  soft-tissue]
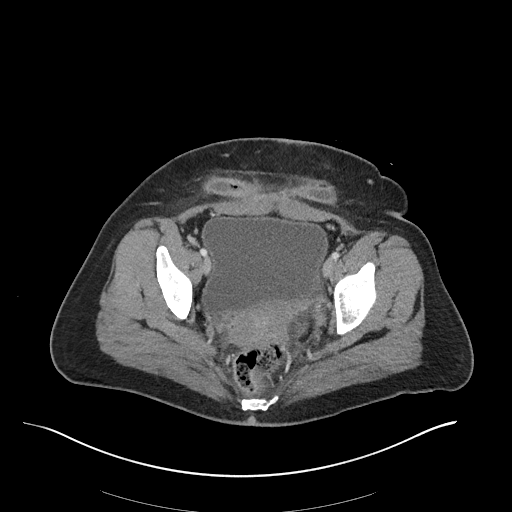
[im 29/53  soft-tissue]
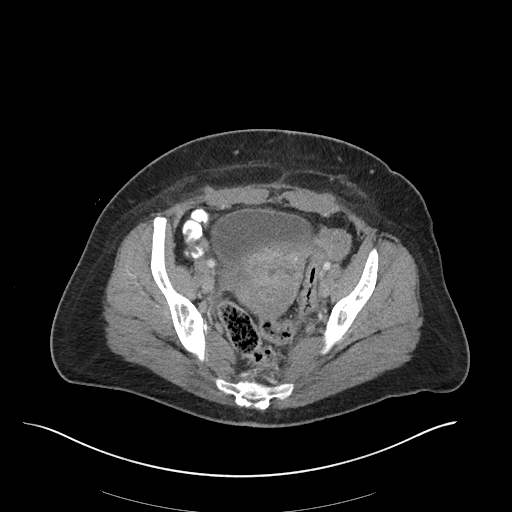
[im 32/53  soft-tissue]
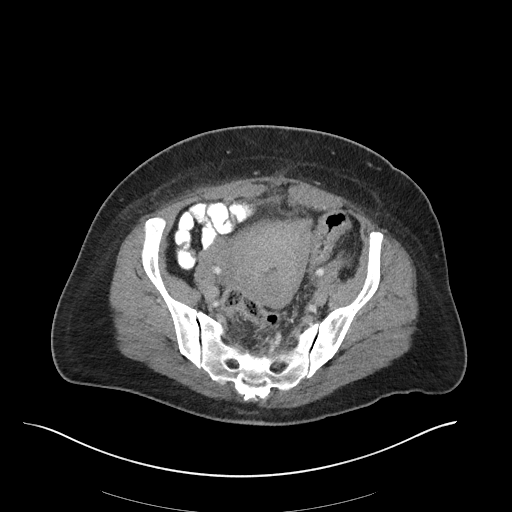
[im 37/53  soft-tissue]
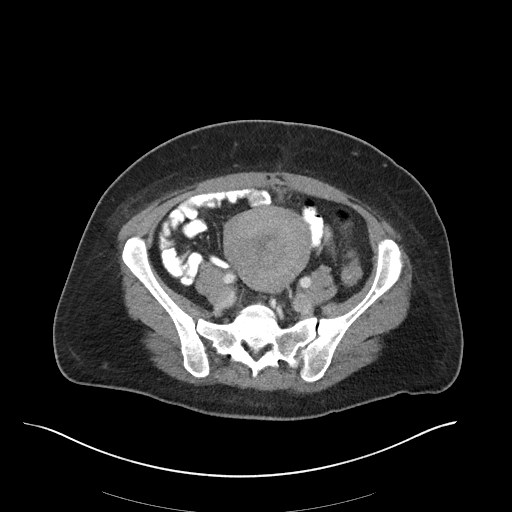
[im 37/53  bone]
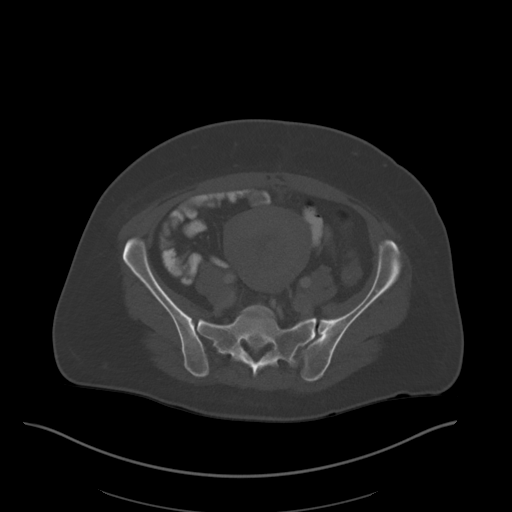
[im 41/53  soft-tissue]
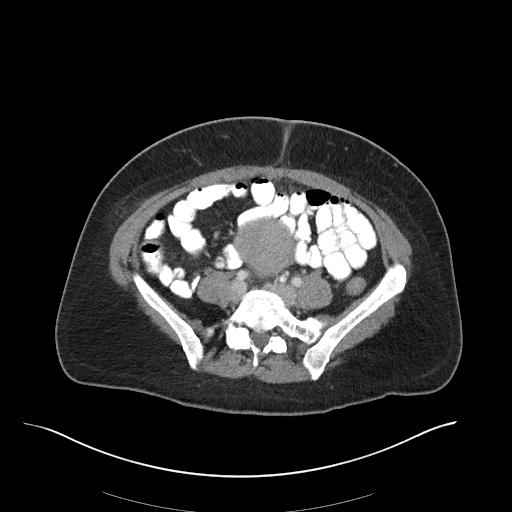
[im 46/53  soft-tissue]
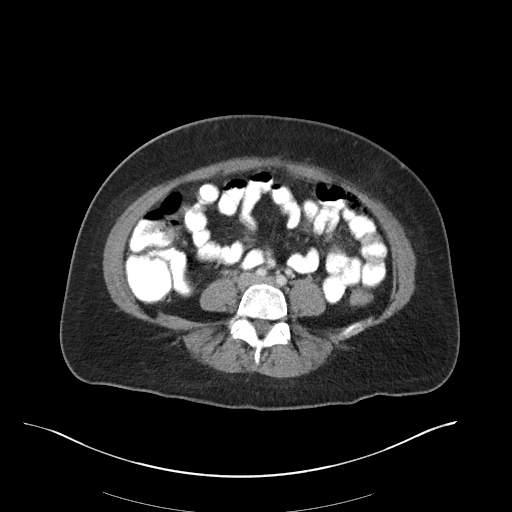
[im 49/53  soft-tissue]
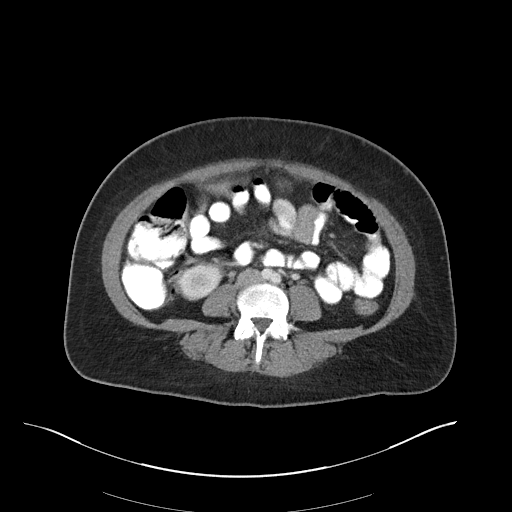

[Series 5: coronal soft tissue · coronal · 0.60mm/px · 3 of 94 slices shown]
[im 32/94  soft-tissue]
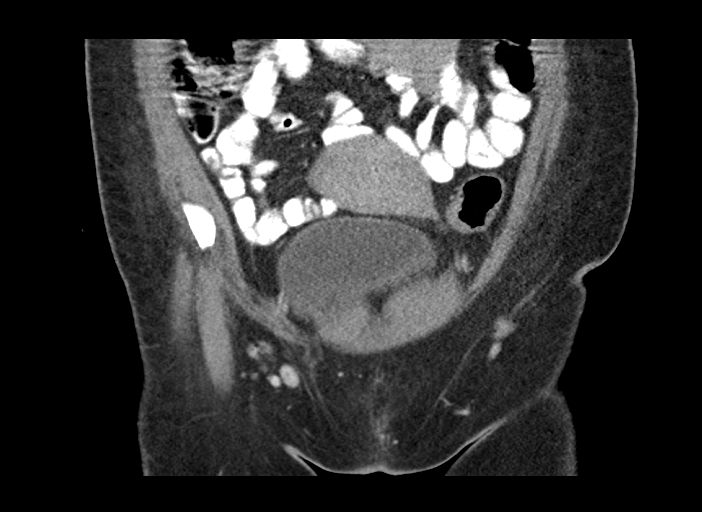
[im 42/94  soft-tissue]
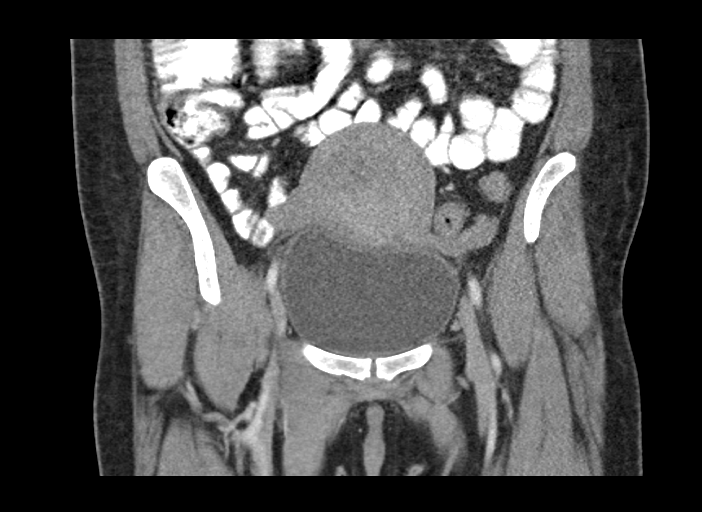
[im 52/94  soft-tissue]
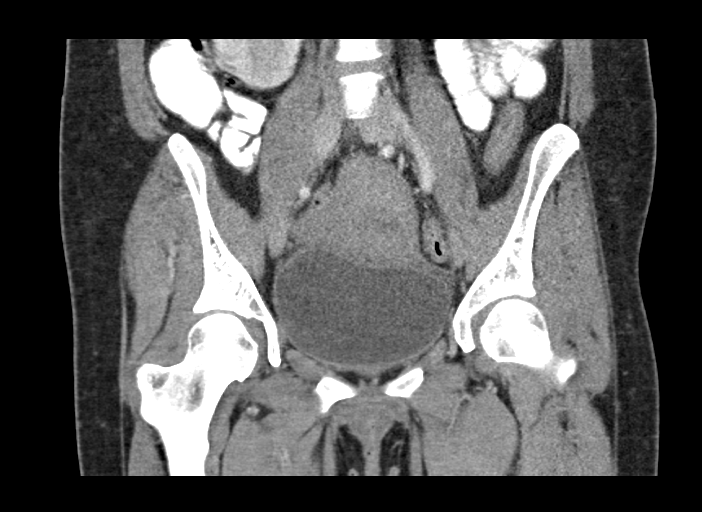

[15 of 46 positions shown; findings below may reference images not displayed]

FINDINGS: Urinary Tract:  No abnormality visualized.

Bowel:  Unremarkable visualized pelvic bowel loops.

Vascular/Lymphatic: No pathologically enlarged lymph nodes. No
significant vascular abnormality seen.

Reproductive: The uterus is enlarged. Horizontal linear
hypoattenuated tract measuring approximately 6 mm in thickness is
seen in the anterior lower uterine segment, likely corresponding to
the incision site. There is a mild convexity of the anterior uterine
wall overlying the incision site. No fluid collections outside of
the uterus are seen.

Other: There is an 12 by 1.5 cm thick-walled horizontally oriented
gas and fluid collection in the lower anterior abdominal wall,
corresponding to the superficial incision site. The collection
extends to the superficial muscular fascia.

Musculoskeletal: No acute osseous abnormalities.
IMPRESSION: 1. 12 cm thick-walled horizontally oriented gas and fluid collection
in the lower anterior abdominal wall, corresponding to the
superficial incision site. The collection extends to the superficial
muscular fascia, and likely represents an abscess.
2. Horizontal linear hypoattenuated tract measuring approximately 6
mm in thickness in the anterior lower uterine segment, likely
corresponding to the incision site. It is difficult to determine by
imaging whether this represents normal scarring from recent surgical
intervention, or an infected surgical site. Clinical correlation is
recommended. If further imaging is deemed necessary, MRI of the
pelvis with contrast may be considered.
3. No intra-abdominal fluid collections outside of the uterus are
seen.

## 2022-08-10 DIAGNOSIS — Z419 Encounter for procedure for purposes other than remedying health state, unspecified: Secondary | ICD-10-CM | POA: Diagnosis not present

## 2022-09-09 DIAGNOSIS — Z419 Encounter for procedure for purposes other than remedying health state, unspecified: Secondary | ICD-10-CM | POA: Diagnosis not present

## 2022-10-10 DIAGNOSIS — Z419 Encounter for procedure for purposes other than remedying health state, unspecified: Secondary | ICD-10-CM | POA: Diagnosis not present

## 2022-11-10 DIAGNOSIS — Z419 Encounter for procedure for purposes other than remedying health state, unspecified: Secondary | ICD-10-CM | POA: Diagnosis not present

## 2022-12-10 DIAGNOSIS — Z419 Encounter for procedure for purposes other than remedying health state, unspecified: Secondary | ICD-10-CM | POA: Diagnosis not present

## 2023-01-10 DIAGNOSIS — Z419 Encounter for procedure for purposes other than remedying health state, unspecified: Secondary | ICD-10-CM | POA: Diagnosis not present

## 2023-02-09 DIAGNOSIS — Z419 Encounter for procedure for purposes other than remedying health state, unspecified: Secondary | ICD-10-CM | POA: Diagnosis not present

## 2023-03-12 DIAGNOSIS — Z419 Encounter for procedure for purposes other than remedying health state, unspecified: Secondary | ICD-10-CM | POA: Diagnosis not present

## 2023-04-12 DIAGNOSIS — Z419 Encounter for procedure for purposes other than remedying health state, unspecified: Secondary | ICD-10-CM | POA: Diagnosis not present

## 2023-05-10 DIAGNOSIS — Z419 Encounter for procedure for purposes other than remedying health state, unspecified: Secondary | ICD-10-CM | POA: Diagnosis not present

## 2023-06-21 DIAGNOSIS — Z419 Encounter for procedure for purposes other than remedying health state, unspecified: Secondary | ICD-10-CM | POA: Diagnosis not present

## 2023-07-21 DIAGNOSIS — Z419 Encounter for procedure for purposes other than remedying health state, unspecified: Secondary | ICD-10-CM | POA: Diagnosis not present

## 2023-08-21 DIAGNOSIS — Z419 Encounter for procedure for purposes other than remedying health state, unspecified: Secondary | ICD-10-CM | POA: Diagnosis not present

## 2023-09-20 DIAGNOSIS — Z419 Encounter for procedure for purposes other than remedying health state, unspecified: Secondary | ICD-10-CM | POA: Diagnosis not present

## 2023-10-21 DIAGNOSIS — Z419 Encounter for procedure for purposes other than remedying health state, unspecified: Secondary | ICD-10-CM | POA: Diagnosis not present

## 2023-11-21 DIAGNOSIS — Z419 Encounter for procedure for purposes other than remedying health state, unspecified: Secondary | ICD-10-CM | POA: Diagnosis not present

## 2023-12-21 DIAGNOSIS — Z419 Encounter for procedure for purposes other than remedying health state, unspecified: Secondary | ICD-10-CM | POA: Diagnosis not present
